# Patient Record
Sex: Female | Born: 1950 | Race: White | Hispanic: No | Marital: Married | Smoking: Never smoker
Health system: Southern US, Community
[De-identification: ages and names within clinical notes are randomized; demographics above are authoritative.]

---

## 1997-08-14 ENCOUNTER — Ambulatory Visit (HOSPITAL_COMMUNITY): Admission: RE | Admit: 1997-08-14 | Discharge: 1997-08-14 | Payer: Self-pay | Admitting: Obstetrics & Gynecology

## 1998-01-14 ENCOUNTER — Other Ambulatory Visit: Admission: RE | Admit: 1998-01-14 | Discharge: 1998-01-14 | Payer: Self-pay | Admitting: *Deleted

## 1998-04-12 ENCOUNTER — Ambulatory Visit (HOSPITAL_COMMUNITY): Admission: RE | Admit: 1998-04-12 | Discharge: 1998-04-12 | Payer: Self-pay | Admitting: *Deleted

## 1998-04-12 ENCOUNTER — Encounter: Payer: Self-pay | Admitting: *Deleted

## 1998-08-30 ENCOUNTER — Encounter: Payer: Self-pay | Admitting: *Deleted

## 1998-08-30 ENCOUNTER — Ambulatory Visit (HOSPITAL_COMMUNITY): Admission: RE | Admit: 1998-08-30 | Discharge: 1998-08-30 | Payer: Self-pay | Admitting: *Deleted

## 1999-01-20 ENCOUNTER — Other Ambulatory Visit: Admission: RE | Admit: 1999-01-20 | Discharge: 1999-01-20 | Payer: Self-pay | Admitting: *Deleted

## 1999-02-10 ENCOUNTER — Ambulatory Visit (HOSPITAL_COMMUNITY): Admission: RE | Admit: 1999-02-10 | Discharge: 1999-02-10 | Payer: Self-pay | Admitting: *Deleted

## 1999-02-10 ENCOUNTER — Encounter: Payer: Self-pay | Admitting: *Deleted

## 1999-09-30 ENCOUNTER — Encounter: Admission: RE | Admit: 1999-09-30 | Discharge: 1999-09-30 | Payer: Self-pay | Admitting: *Deleted

## 1999-09-30 ENCOUNTER — Encounter: Payer: Self-pay | Admitting: *Deleted

## 2000-05-12 ENCOUNTER — Other Ambulatory Visit: Admission: RE | Admit: 2000-05-12 | Discharge: 2000-05-12 | Payer: Self-pay | Admitting: *Deleted

## 2000-09-15 ENCOUNTER — Ambulatory Visit (HOSPITAL_COMMUNITY): Admission: RE | Admit: 2000-09-15 | Discharge: 2000-09-15 | Payer: Self-pay | Admitting: Orthopaedic Surgery

## 2000-09-15 ENCOUNTER — Encounter: Payer: Self-pay | Admitting: Orthopaedic Surgery

## 2000-09-21 ENCOUNTER — Ambulatory Visit (HOSPITAL_BASED_OUTPATIENT_CLINIC_OR_DEPARTMENT_OTHER): Admission: RE | Admit: 2000-09-21 | Discharge: 2000-09-21 | Payer: Self-pay | Admitting: Orthopaedic Surgery

## 2000-10-06 ENCOUNTER — Encounter: Admission: RE | Admit: 2000-10-06 | Discharge: 2000-10-06 | Payer: Self-pay | Admitting: *Deleted

## 2000-10-06 ENCOUNTER — Encounter: Payer: Self-pay | Admitting: *Deleted

## 2000-10-22 ENCOUNTER — Emergency Department (HOSPITAL_COMMUNITY): Admission: EM | Admit: 2000-10-22 | Discharge: 2000-10-22 | Payer: Self-pay | Admitting: Emergency Medicine

## 2000-11-09 ENCOUNTER — Encounter: Payer: Self-pay | Admitting: Orthopaedic Surgery

## 2000-11-09 ENCOUNTER — Encounter: Admission: RE | Admit: 2000-11-09 | Discharge: 2000-11-09 | Payer: Self-pay | Admitting: Orthopaedic Surgery

## 2000-11-09 ENCOUNTER — Ambulatory Visit (HOSPITAL_COMMUNITY): Admission: RE | Admit: 2000-11-09 | Discharge: 2000-11-09 | Payer: Self-pay | Admitting: Urology

## 2000-11-13 ENCOUNTER — Encounter: Payer: Self-pay | Admitting: Urology

## 2000-11-13 ENCOUNTER — Ambulatory Visit (HOSPITAL_COMMUNITY): Admission: RE | Admit: 2000-11-13 | Discharge: 2000-11-13 | Payer: Self-pay | Admitting: Urology

## 2001-05-24 ENCOUNTER — Other Ambulatory Visit: Admission: RE | Admit: 2001-05-24 | Discharge: 2001-05-24 | Payer: Self-pay | Admitting: *Deleted

## 2001-06-10 ENCOUNTER — Encounter: Admission: RE | Admit: 2001-06-10 | Discharge: 2001-06-10 | Payer: Self-pay | Admitting: Orthopaedic Surgery

## 2001-06-10 ENCOUNTER — Encounter: Payer: Self-pay | Admitting: Orthopaedic Surgery

## 2001-06-15 ENCOUNTER — Encounter: Admission: RE | Admit: 2001-06-15 | Discharge: 2001-06-15 | Payer: Self-pay | Admitting: *Deleted

## 2001-06-15 ENCOUNTER — Encounter: Payer: Self-pay | Admitting: *Deleted

## 2001-10-13 ENCOUNTER — Encounter: Payer: Self-pay | Admitting: *Deleted

## 2001-10-13 ENCOUNTER — Encounter: Admission: RE | Admit: 2001-10-13 | Discharge: 2001-10-13 | Payer: Self-pay | Admitting: *Deleted

## 2002-06-14 ENCOUNTER — Other Ambulatory Visit: Admission: RE | Admit: 2002-06-14 | Discharge: 2002-06-14 | Payer: Self-pay | Admitting: *Deleted

## 2002-10-02 ENCOUNTER — Encounter: Payer: Self-pay | Admitting: *Deleted

## 2002-10-02 ENCOUNTER — Ambulatory Visit (HOSPITAL_COMMUNITY): Admission: RE | Admit: 2002-10-02 | Discharge: 2002-10-02 | Payer: Self-pay | Admitting: *Deleted

## 2002-10-18 ENCOUNTER — Encounter: Admission: RE | Admit: 2002-10-18 | Discharge: 2002-10-18 | Payer: Self-pay | Admitting: *Deleted

## 2002-10-18 ENCOUNTER — Encounter: Payer: Self-pay | Admitting: *Deleted

## 2003-05-02 ENCOUNTER — Other Ambulatory Visit: Admission: RE | Admit: 2003-05-02 | Discharge: 2003-05-02 | Payer: Self-pay | Admitting: *Deleted

## 2003-06-27 ENCOUNTER — Encounter: Admission: RE | Admit: 2003-06-27 | Discharge: 2003-06-27 | Payer: Self-pay | Admitting: Internal Medicine

## 2003-10-22 ENCOUNTER — Encounter: Admission: RE | Admit: 2003-10-22 | Discharge: 2003-10-22 | Payer: Self-pay | Admitting: *Deleted

## 2003-11-08 ENCOUNTER — Encounter: Admission: RE | Admit: 2003-11-08 | Discharge: 2003-11-08 | Payer: Self-pay | Admitting: Diagnostic Radiology

## 2003-11-08 ENCOUNTER — Ambulatory Visit (HOSPITAL_COMMUNITY): Admission: RE | Admit: 2003-11-08 | Discharge: 2003-11-08 | Payer: Self-pay | Admitting: Neurosurgery

## 2004-05-22 ENCOUNTER — Other Ambulatory Visit: Admission: RE | Admit: 2004-05-22 | Discharge: 2004-05-22 | Payer: Self-pay | Admitting: *Deleted

## 2004-06-16 ENCOUNTER — Ambulatory Visit (HOSPITAL_COMMUNITY): Admission: RE | Admit: 2004-06-16 | Discharge: 2004-06-16 | Payer: Self-pay | Admitting: *Deleted

## 2004-08-08 ENCOUNTER — Encounter: Admission: RE | Admit: 2004-08-08 | Discharge: 2004-08-08 | Payer: Self-pay | Admitting: Diagnostic Radiology

## 2004-10-22 ENCOUNTER — Encounter: Admission: RE | Admit: 2004-10-22 | Discharge: 2004-10-22 | Payer: Self-pay | Admitting: *Deleted

## 2005-05-05 ENCOUNTER — Encounter: Admission: RE | Admit: 2005-05-05 | Discharge: 2005-05-05 | Payer: Self-pay | Admitting: Specialist

## 2005-07-29 ENCOUNTER — Other Ambulatory Visit: Admission: RE | Admit: 2005-07-29 | Discharge: 2005-07-29 | Payer: Self-pay | Admitting: Obstetrics & Gynecology

## 2005-10-27 ENCOUNTER — Encounter: Admission: RE | Admit: 2005-10-27 | Discharge: 2005-10-27 | Payer: Self-pay | Admitting: Internal Medicine

## 2006-06-25 ENCOUNTER — Encounter: Admission: RE | Admit: 2006-06-25 | Discharge: 2006-06-25 | Payer: Self-pay | Admitting: Orthopaedic Surgery

## 2006-08-09 ENCOUNTER — Ambulatory Visit (HOSPITAL_BASED_OUTPATIENT_CLINIC_OR_DEPARTMENT_OTHER): Admission: RE | Admit: 2006-08-09 | Discharge: 2006-08-09 | Payer: Self-pay | Admitting: Orthopaedic Surgery

## 2006-11-01 ENCOUNTER — Encounter: Admission: RE | Admit: 2006-11-01 | Discharge: 2006-11-01 | Payer: Self-pay | Admitting: Internal Medicine

## 2007-11-03 ENCOUNTER — Ambulatory Visit (HOSPITAL_COMMUNITY): Admission: RE | Admit: 2007-11-03 | Discharge: 2007-11-03 | Payer: Self-pay | Admitting: Obstetrics & Gynecology

## 2007-11-07 ENCOUNTER — Encounter: Admission: RE | Admit: 2007-11-07 | Discharge: 2007-11-07 | Payer: Self-pay | Admitting: Obstetrics & Gynecology

## 2007-11-10 ENCOUNTER — Encounter: Admission: RE | Admit: 2007-11-10 | Discharge: 2007-11-10 | Payer: Self-pay | Admitting: Obstetrics & Gynecology

## 2008-11-17 ENCOUNTER — Emergency Department (HOSPITAL_COMMUNITY): Admission: EM | Admit: 2008-11-17 | Discharge: 2008-11-18 | Payer: Self-pay | Admitting: Emergency Medicine

## 2008-11-19 ENCOUNTER — Encounter: Admission: RE | Admit: 2008-11-19 | Discharge: 2008-11-19 | Payer: Self-pay | Admitting: Internal Medicine

## 2008-11-21 ENCOUNTER — Encounter: Admission: RE | Admit: 2008-11-21 | Discharge: 2008-11-21 | Payer: Self-pay | Admitting: Obstetrics & Gynecology

## 2009-10-02 ENCOUNTER — Encounter: Admission: RE | Admit: 2009-10-02 | Discharge: 2009-10-02 | Payer: Self-pay | Admitting: Internal Medicine

## 2009-11-22 ENCOUNTER — Encounter: Admission: RE | Admit: 2009-11-22 | Discharge: 2009-11-22 | Payer: Self-pay | Admitting: Internal Medicine

## 2010-06-22 ENCOUNTER — Encounter: Payer: Self-pay | Admitting: Interventional Radiology

## 2010-06-22 ENCOUNTER — Encounter: Payer: Self-pay | Admitting: Neurology

## 2010-09-08 LAB — CBC
HCT: 38 % (ref 36.0–46.0)
Hemoglobin: 13.1 g/dL (ref 12.0–15.0)
MCHC: 34.5 g/dL (ref 30.0–36.0)
Platelets: 188 10*3/uL (ref 150–400)
RDW: 12.6 % (ref 11.5–15.5)

## 2010-09-08 LAB — BASIC METABOLIC PANEL
BUN: 22 mg/dL (ref 6–23)
Chloride: 104 mEq/L (ref 96–112)
Creatinine, Ser: 1.08 mg/dL (ref 0.4–1.2)
GFR calc Af Amer: >60 mL/min (ref >60)
Potassium: 4 mEq/L (ref 3.5–5.1)

## 2010-09-08 LAB — URINALYSIS, ROUTINE W REFLEX MICROSCOPIC
Bilirubin Urine: NEGATIVE
Glucose, UA: NEGATIVE mg/dL
Ketones, ur: NEGATIVE mg/dL
Protein, ur: NEGATIVE mg/dL
Specific Gravity, Urine: 1.017 (ref 1.005–1.030)
Urobilinogen, UA: 0.2 mg/dL (ref 0.0–1.0)
pH: 6 (ref 5.0–8.0)

## 2010-09-08 LAB — DIFFERENTIAL
Basophils Relative: 1 % (ref 0–1)
Neutro Abs: 12.8 10*3/uL — ABNORMAL HIGH (ref 1.7–7.7)

## 2010-10-17 NOTE — Op Note (Signed)
[facility]  Patient:    Mary Bradley, Mary Bradley                       MRN: [id number] Proc. Date: 11/09/00 Adm. Date:  [id number] Disc. Date: [id number] Attending:  Shelba Flake                           Operative Report  PREOPERATIVE DIAGNOSIS:  Right distal ureteral lithiasis.  POSTOPERATIVE DIAGNOSIS:  Right distal ureteral lithiasis.  OPERATION PERFORMED:  Cystourethroscopy, right retrograde ureteral pyelogram, right ureteroscopy with stone removal and placement of right double J stent.  SURGEON:  Gaynelle Arabian, M.D.  ANESTHESIA:  General endotracheal.  ESTIMATED BLOOD LOSS:  Negligible.  TUBES:  24 cm 6 [place] Surgitek double pigtail stent.  COMPLICATIONS:  None.  INDICATIONS FOR PROCEDURE:  Mary Bradley is a lovely 60 year old white female who presented with an 18 hour history of right buttocks pain, nausea, vomiting and lethargy. She subsequently underwent CT scan urogram which revealed a distal calculus with significant hydroureteronephrosis. She also had two punctate stones within the left kidney. With the 18 hours or persistent pain, nausea and vomiting, it was elected to proceed with the above procedure.  DESCRIPTION OF PROCEDURE:  The patient was placed in the supine position after proper general endotracheal anesthesia was placed in the dorsal lithotomy position, prepped and draped with Betadine in a sterile fashion. Cystourethroscopy was performed with a 22.5 French Olympus panendoscope utilizing the 12 and 70 degree lenses. The bladder was carefully inspected and efflux of clear urine was noted from the left ureteral orifice. The bladder was smooth wall and the urethra was normal. A 0.38 French Teflon coated guidewire was placed into the right renal pelvis and a flush of hydronephrotic flush was noted and attempted ureteroscopy with the short thin ACMI ureteroscope revealed what was felt to be a ______  distal ureteral  stricture approximately 2-3 mm proximal to the ureteral orifice. It was felt that balloon dilatation was indicated and under fluoroscopic guidance, the right distal ureter was balloon dilated for three minutes at 10 atmospheres of pressure with a 5 cm 4 mm microvasive balloon dilator and this was removed and the wire was maintained in place. Ureteroscopy was then performed with a short thin ACMI ureteroscope. The lower two-thirds ureter was examined and the stricture was widely patent and the stone was easily flushed from the ureter into the bladder. No other lesions were noted to be present and no perforations were noted. The stone was extracted, the ureteroscope was removed. Utilizing a 6 [place] open ended catheter over the wire, a retrograde ureteral pyelogram was performed and there was no extravasation noted. The wire was replaced and under fluoroscopic guidance, a 24 cm 6 [place] Surgitek double pigtail stent was placed with a pullout string and noted to be in good position fluoroscopically. The bladder was drained, the panendoscope was removed and the patient was taken to the recovery room stable and the stone will be submitted for stone analysis. DD:  11/09/00 TD:  11/09/00 Job: [id number]

## 2010-10-17 NOTE — Op Note (Signed)
Mary Bradley, PEACHEY               ACCOUNT NO.:  [id number]   MEDICAL RECORD NO.:  [id number]          PATIENT TYPE:  AMB   LOCATION:  DSC                          FACILITY:  [facility]   PHYSICIAN:  Claude Manges. Whitfield, M.D.DATE OF BIRTH:  1951/01/30   DATE OF PROCEDURE:  08/09/2006  DATE OF DISCHARGE:                               OPERATIVE REPORT   PREOPERATIVE DIAGNOSIS:  Complex tear posterior horn medial meniscus.   POSTOPERATIVE DIAGNOSIS:  Complex tear posterior horn medial meniscus.   PROCEDURES:  1. Diagnostic arthroscopy right knee.  2. Partial medial meniscectomy.   SURGEON:  Claude Manges. Cleophas Dunker, M.D.   ASSISTANT:  None.   ANESTHESIA:  Local with MAC.   COMPLICATIONS:  None.   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  Pain initiated when  she was walking the dogs in the backyard with a twisting mechanism of  her knee, experienced onset of pain.  She has continued to have pain to  the point of compromise.  She has had a recent MRI scan that revealed a  tear of the posterior horn of the medial meniscus reaching the meniscus  undersurface with a small radial component consistent with a complex  tear.  There was some focal fissuring of the cartilage of the medial  patella facet.  She is now to have an arthroscopic evaluation.   PROCEDURE:  The patient comfortable on the operating table and under IV  sedation, the right lower extremity was placed in a thigh holder.  The  leg was then prepped with DuraPrep from the thigh holder to the ankle.  Sterile draping was performed.   Diagnostic arthroscopy was performed using a medial and lateral  parapatellar tendon puncture sites.  There was a clear yellow joint  effusion.   Diagnostic arthroscopy revealed no evidence of synovitis in the superior  pouch.  There were no loose bodies.  There was minimal chondromalacia of  the patella as outlined by the  MRI scan.  The gutters were clear.   The lateral meniscus was clear of pathology.  There was no evidence of  chondromalacia.  The lateral compartment ACL was intact.   The medial compartment confirmed the presence of a complex tear of the  posterior third of the medial meniscus.  There were a combination of  flap and horizontal cleavage tears.  Using the basket forceps, the Cuda  shaver and the ArthroCare wand the torn portions were removed and the  remaining meniscal rim was sealed with the articular wand.  I carefully  probed the remaining meniscus without evidence of any loose material or  unstable meniscal segments the joint was then explored without evidence  of loose material.  Two puncture sites left open and infiltrated 0.25%  Marcaine with epinephrine.  A sterile bulky dressing was applied  followed by an Ace bandage.   PLAN:  Vicodin for pain.  Office 1 week.      Claude Manges. Cleophas Dunker, M.D.  Electronically Signed     PWW/MEDQ  D:  08/09/2006  T:  08/09/2006  Job:  [id number]

## 2010-10-17 NOTE — Op Note (Signed)
[place]  Patient:    Mary Bradley, Mary Bradley                       MRN: [id number] Proc. Date: 09/21/00 Adm. Date:  [id number] Attending:  Randolm Idol                           Operative Report  PREOPERATIVE DIAGNOSIS:  Tear of medial meniscus, left knee.  POSTOPERATIVE DIAGNOSIS:  Tear of medial meniscus, left knee.  OPERATION PERFORMED: 1. Diagnostic arthroscopy, left knee. 2. Partial medial meniscectomy.  SURGEON:  Claude Manges. Cleophas Dunker, M.D.  ANESTHESIA:  IV sedation and local Marcaine with epinephrine.  COMPLICATIONS:  None.  INDICATIONS FOR PROCEDURE:  The patient is a 60 year old female with about a three-week history of injury to her left knee while playing tennis.  She had a positive effusion associated with pain, causing limp and limitation of activities.  MRI scan was performed one week ago because of persistent symptoms revealing a complex tear of the posterior horn of the medial meniscus.  She is now to have arthroscopic evaluation.  DESCRIPTION OF PROCEDURE:  With the patient comfortable on the operating table and under minimal IV sedation, the left lower extremity was placed in the thigh tourniquet and then a thigh holder.  The leg was then prepped with DuraPrep from the thigh holder to the ankle.  Sterile draping was performed.  Diagnostic arthroscopy was performed using a medial and lateral parapatellar tendon stab wound.  A third portal was not necessary for irrigation as I could adequately irrigate through the two established portals.  The arthroscope was placed in the lateral joint.  A diagnostic arthroscopy was then performed.  There was no evidence of any loose material in the superior pouch.  The patellofemoral joint was clear.  The gutters were clear.  I did not see evidence of a significant plica formation.  The ACL was perfectly intact.  The lateral compartment was intact without evidence of meniscal  pathology or chondromalacia.  The medial compartment was then evaluated.  The anterior half of the medial meniscus appeared to be perfectly intact.  There was an obvious complex tear beginning at about the midportion of the meniscus extending posteriorly. There was a combination of flap tears and cleavage tears and significant maceration of much of the posterior third of the meniscus.  Using a series of basket forceps and the 3.5 mm cuda shaver, the meniscus was removed to stable rim.  I carefully probed and felt that I had no further unstable segments. There was no evidence of any significant bleeding.  Again I carefully probed the meniscus. I  did not see any loose fragments.  The joint was again inspected without evidence of loose material.  The two stab wounds were left open and infiltrated with 0.25% Marcaine with epinephrine.  A sterile bulky dressing was applied followed by an Ace bandage. The patient tolerated the procedure well without complications.  PLAN:  Percocet for pain.  Phenergan as an antiemetic.  Office one week. Instructions given regarding potential problems with infection or phlebitis. DD:  09/21/00 TD:  09/22/00 Job: [id number]

## 2010-10-17 NOTE — Op Note (Signed)
[facility]  Patient:    Mary Bradley, Mary Bradley                       MRN: [id number] Proc. Date: 10/22/00 Adm. Date:  [id number] Disc. Date: [id number] Attending:  Shelba Flake CC:         [name]. Effie Shy, M.D.  Colon Flattery. Gery Pray, M.D.   Operative Report  DATE OF BIRTH:  1951/02/04  I had the pleasure to see Mary Bradley in the emergency room, Oct 22, 2000. Mary Bradley is a pleasant 60 year old white female, who sustained an injury to her right thumb when she was trying to break up a scuffle between her dogs. During the scuffle, her Mary Bradley bit her thumb.  This is her right hand dominant right thumb.  She has dorsal and volar lacerations.  Dorsal lacerations are x 2 in the region of the dorsoradial MCP region.  Volar laceration is volar ulnar at the volar MCP region.  The patient states that she has fairly significant pain but was not particularly concerned until the bleeding would not stop.  She subsequently was seen in the emergency room.  Dr. Mechele Collin L. Effie Shy evaluated her and asked me to see her in regards to her upper extremity predicament.  Tetanus shot has been given.  The patient denies any specific numbness and tingling in the radial or ulnar distal nerve distribution subjectively.  I have discussed with her all issues.  She denies any other injury secondary to this recent occurrence.  PAST MEDICAL HISTORY:  Migraine headaches.  PAST SURGICAL HISTORY:  Knee arthroscopy recently by Dr. Cleophas Dunker.  MEDICINES:  The patient takes p.r.n. medicines only.  DRUG ALLERGIES:  PENICILLIN including AMOXICILLIN.  SOCIAL HISTORY:  She is a nonsmoker.  She is a Teacher, early years/pre.  PHYSICAL EXAMINATION:  GENERAL:  A very pleasant white female alert and oriented and in no acute distress.  VITAL SIGNS:  Afebrile.  Vital signs are stable.  RIGHT THUMB:  The patients right thumb has positive refill at the tip.  She has two dorsoradial lacerations secondary to  dog bite.  One is over the MCP region.  She has a volar ulnar laceration as well.  FPL and EPL are intact. There is no ulnar, radial, or collateral ligament insufficiency about the MCP joint.  IP joint, and CMC joint are stable.  The remaining fingers, hand, forearm, and shoulder including the elbow are nontender.  There are no signs of dystrophic reaction or acute neurovascular compromise.  No signs of compartment syndrome.  The patient had two of three bite wounds which are fairly deep, one dorsal, one volar.  The patient also has a bit of a contusion to her lower extremity; however, there are no open lacerations in this region.  I have reviewed this.  There is ecchymosis only in this region.  IMPRESSION:  Status post dog bit to the right thumb.  PLAN:  I verbally consented her for I&D and repair of structure as necessary.  DESCRIPTION OF PROCEDURE:  Mary Bradley was given a lidocaine digital block about the thumb.  She tolerated this well without difficulty.  Following this, she underwent a Betadine scrub followed by Betadine painting and draping.  Once this was done, she underwent an I&D of the dorsal wounds.  One dorsal wound was superficial in nature only and required only a light I&D.  The second wound was quite deep and tunneled underneath the skin.  This was opened.  The prenecrotic edges were debrided with #15 blade scalpel, and the patient then had copious irrigation of the dead space.  The dead space was then packed with Iodoform gauze after irrigation.  I should note that there was a fairly dead space.  I did not see any obvious joint encroachment.  The volar ulnar wound similarly had some tunneling; however, it was not as impressive as the dorsal wound.  This was treated with I&D, including debridement of the prenecrotic skin edge and I&D of the skin and subcutaneous tissue.  In all, the patient underwent I&D of the skin and subcutaneous tissue in the deep recesses where the  bite wounds tracked.  The peritendonous regions were also debrided.  She tolerated this without difficulty, and the Iodoform was placed to my satisfaction.  She was neurovascularly intact after the procedure in terms of her vascular exam; however, she certainly had numbness in her thumb secondary to the previously placed block.  She was sterilely dressed and instructed on wound care and follow-up.  The patient will follow up in 4-5 days to ensure that she is coming along nicely.  She is going to pull her Iodoform tomorrow and begin b.i.d. peroxide treatments and continue dressing changes.  I have asked her to refrain from stressful sport activity about the thumb until she is seen in follow-up.  We have placed her on clindamycin 300 mg t.i.d. x 7 days and Cipro 500 mg 1 p.o. b.i.d. x 7 days.  I have asked her to notify me should any abrupt changes occur under exam or problems develop.  All questions have been encouraged and answered.  It was certainly a pleasure to see her today, and I appreciate the kind referral from Dr. Effie Shy. DD:  10/22/00 TD:  10/24/00 Job: [id number]

## 2010-10-24 ENCOUNTER — Other Ambulatory Visit: Payer: Self-pay | Admitting: Internal Medicine

## 2010-10-24 DIAGNOSIS — Z1231 Encounter for screening mammogram for malignant neoplasm of breast: Secondary | ICD-10-CM

## 2010-12-08 ENCOUNTER — Ambulatory Visit
Admission: RE | Admit: 2010-12-08 | Discharge: 2010-12-08 | Disposition: A | Discharge status: Home / Self Care | Payer: PRIVATE HEALTH INSURANCE | Source: Ambulatory Visit | Attending: Internal Medicine | Admitting: Internal Medicine

## 2010-12-08 DIAGNOSIS — Z1231 Encounter for screening mammogram for malignant neoplasm of breast: Secondary | ICD-10-CM

## 2011-07-06 ENCOUNTER — Ambulatory Visit
Admission: RE | Admit: 2011-07-06 | Discharge: 2011-07-06 | Disposition: A | Discharge status: Home / Self Care | Payer: PRIVATE HEALTH INSURANCE | Source: Ambulatory Visit | Attending: Obstetrics & Gynecology | Admitting: Obstetrics & Gynecology

## 2011-07-06 ENCOUNTER — Other Ambulatory Visit: Payer: Self-pay | Admitting: Obstetrics & Gynecology

## 2011-07-06 DIAGNOSIS — R1900 Intra-abdominal and pelvic swelling, mass and lump, unspecified site: Secondary | ICD-10-CM

## 2011-11-11 ENCOUNTER — Other Ambulatory Visit: Payer: Self-pay | Admitting: Obstetrics & Gynecology

## 2011-11-11 DIAGNOSIS — Z1231 Encounter for screening mammogram for malignant neoplasm of breast: Secondary | ICD-10-CM

## 2011-11-16 ENCOUNTER — Other Ambulatory Visit: Payer: Self-pay | Admitting: Internal Medicine

## 2011-11-16 DIAGNOSIS — M8580 Other specified disorders of bone density and structure, unspecified site: Secondary | ICD-10-CM

## 2011-12-09 ENCOUNTER — Ambulatory Visit
Admission: RE | Admit: 2011-12-09 | Discharge: 2011-12-09 | Disposition: A | Discharge status: Home / Self Care | Payer: PRIVATE HEALTH INSURANCE | Source: Ambulatory Visit | Attending: Obstetrics & Gynecology | Admitting: Obstetrics & Gynecology

## 2011-12-09 DIAGNOSIS — Z1231 Encounter for screening mammogram for malignant neoplasm of breast: Secondary | ICD-10-CM

## 2011-12-29 ENCOUNTER — Ambulatory Visit
Admission: RE | Admit: 2011-12-29 | Discharge: 2011-12-29 | Disposition: A | Discharge status: Home / Self Care | Payer: PRIVATE HEALTH INSURANCE | Source: Ambulatory Visit | Attending: Internal Medicine | Admitting: Internal Medicine

## 2011-12-29 DIAGNOSIS — M8580 Other specified disorders of bone density and structure, unspecified site: Secondary | ICD-10-CM

## 2012-10-11 ENCOUNTER — Ambulatory Visit
Admission: RE | Admit: 2012-10-11 | Discharge: 2012-10-11 | Disposition: A | Discharge status: Home / Self Care | Payer: PRIVATE HEALTH INSURANCE | Source: Ambulatory Visit | Attending: Family Medicine | Admitting: Family Medicine

## 2012-10-11 ENCOUNTER — Other Ambulatory Visit: Payer: Self-pay | Admitting: Family Medicine

## 2012-10-11 DIAGNOSIS — R319 Hematuria, unspecified: Secondary | ICD-10-CM

## 2012-10-11 DIAGNOSIS — R109 Unspecified abdominal pain: Secondary | ICD-10-CM

## 2012-10-11 DIAGNOSIS — Z87442 Personal history of urinary calculi: Secondary | ICD-10-CM

## 2012-10-11 DIAGNOSIS — R10A2 Flank pain, left side: Secondary | ICD-10-CM

## 2012-11-09 ENCOUNTER — Other Ambulatory Visit: Payer: Self-pay

## 2012-11-09 DIAGNOSIS — Z1231 Encounter for screening mammogram for malignant neoplasm of breast: Secondary | ICD-10-CM

## 2012-12-14 ENCOUNTER — Ambulatory Visit
Admission: RE | Admit: 2012-12-14 | Discharge: 2012-12-14 | Disposition: A | Discharge status: Home / Self Care | Payer: PRIVATE HEALTH INSURANCE | Source: Ambulatory Visit

## 2012-12-14 DIAGNOSIS — Z1231 Encounter for screening mammogram for malignant neoplasm of breast: Secondary | ICD-10-CM

## 2012-12-26 ENCOUNTER — Other Ambulatory Visit (HOSPITAL_COMMUNITY): Payer: Self-pay | Admitting: Obstetrics & Gynecology

## 2012-12-26 DIAGNOSIS — D219 Benign neoplasm of connective and other soft tissue, unspecified: Secondary | ICD-10-CM

## 2012-12-28 ENCOUNTER — Other Ambulatory Visit (HOSPITAL_COMMUNITY): Payer: Self-pay | Admitting: Obstetrics & Gynecology

## 2012-12-28 DIAGNOSIS — D219 Benign neoplasm of connective and other soft tissue, unspecified: Secondary | ICD-10-CM

## 2012-12-29 ENCOUNTER — Ambulatory Visit (HOSPITAL_COMMUNITY)
Admission: RE | Admit: 2012-12-29 | Discharge: 2012-12-29 | Disposition: A | Discharge status: Home / Self Care | Payer: PRIVATE HEALTH INSURANCE | Source: Ambulatory Visit | Attending: Obstetrics & Gynecology | Admitting: Obstetrics & Gynecology

## 2012-12-29 ENCOUNTER — Ambulatory Visit
Admission: RE | Admit: 2012-12-29 | Discharge: 2012-12-29 | Disposition: A | Discharge status: Home / Self Care | Payer: PRIVATE HEALTH INSURANCE | Source: Ambulatory Visit | Attending: Internal Medicine | Admitting: Internal Medicine

## 2012-12-29 ENCOUNTER — Other Ambulatory Visit: Payer: Self-pay | Admitting: Internal Medicine

## 2012-12-29 DIAGNOSIS — Z87442 Personal history of urinary calculi: Secondary | ICD-10-CM | POA: Insufficient documentation

## 2012-12-29 DIAGNOSIS — N854 Malposition of uterus: Secondary | ICD-10-CM | POA: Insufficient documentation

## 2012-12-29 DIAGNOSIS — Z0000 Encounter for general adult medical examination without abnormal findings: Secondary | ICD-10-CM

## 2012-12-29 DIAGNOSIS — D259 Leiomyoma of uterus, unspecified: Secondary | ICD-10-CM | POA: Insufficient documentation

## 2012-12-29 DIAGNOSIS — Z780 Asymptomatic menopausal state: Secondary | ICD-10-CM | POA: Insufficient documentation

## 2012-12-29 DIAGNOSIS — D219 Benign neoplasm of connective and other soft tissue, unspecified: Secondary | ICD-10-CM

## 2013-12-20 ENCOUNTER — Other Ambulatory Visit: Payer: Self-pay | Admitting: Internal Medicine

## 2013-12-20 DIAGNOSIS — M810 Age-related osteoporosis without current pathological fracture: Secondary | ICD-10-CM

## 2013-12-20 DIAGNOSIS — Z1231 Encounter for screening mammogram for malignant neoplasm of breast: Secondary | ICD-10-CM

## 2014-01-03 ENCOUNTER — Ambulatory Visit
Admission: RE | Admit: 2014-01-03 | Discharge: 2014-01-03 | Disposition: A | Discharge status: Home / Self Care | Payer: PRIVATE HEALTH INSURANCE | Source: Ambulatory Visit | Attending: Internal Medicine | Admitting: Internal Medicine

## 2014-01-03 ENCOUNTER — Encounter (INDEPENDENT_AMBULATORY_CARE_PROVIDER_SITE_OTHER): Payer: Self-pay

## 2014-01-03 DIAGNOSIS — M810 Age-related osteoporosis without current pathological fracture: Secondary | ICD-10-CM

## 2014-01-03 DIAGNOSIS — Z1231 Encounter for screening mammogram for malignant neoplasm of breast: Secondary | ICD-10-CM

## 2014-03-16 ENCOUNTER — Other Ambulatory Visit: Payer: Self-pay

## 2014-08-31 ENCOUNTER — Other Ambulatory Visit (HOSPITAL_COMMUNITY): Payer: Self-pay | Admitting: *Deleted

## 2014-09-03 ENCOUNTER — Encounter (HOSPITAL_COMMUNITY)
Admission: RE | Admit: 2014-09-03 | Discharge: 2014-09-03 | Disposition: A | Discharge status: Home / Self Care | Payer: PRIVATE HEALTH INSURANCE | Source: Ambulatory Visit | Attending: Internal Medicine | Admitting: Internal Medicine

## 2014-09-03 DIAGNOSIS — M810 Age-related osteoporosis without current pathological fracture: Secondary | ICD-10-CM | POA: Diagnosis present

## 2014-09-03 MED ORDER — DENOSUMAB 60 MG/ML ~~LOC~~ SOLN
60.0000 mg | Freq: Once | SUBCUTANEOUS | Status: AC
  Administered 2014-09-03: 60 mg via SUBCUTANEOUS
  Filled 2014-09-03: qty 1

## 2014-12-05 ENCOUNTER — Other Ambulatory Visit: Payer: Self-pay

## 2014-12-05 DIAGNOSIS — Z1231 Encounter for screening mammogram for malignant neoplasm of breast: Secondary | ICD-10-CM

## 2015-01-09 ENCOUNTER — Ambulatory Visit
Admission: RE | Admit: 2015-01-09 | Discharge: 2015-01-09 | Disposition: A | Discharge status: Home / Self Care | Payer: PRIVATE HEALTH INSURANCE | Source: Ambulatory Visit

## 2015-01-09 DIAGNOSIS — Z1231 Encounter for screening mammogram for malignant neoplasm of breast: Secondary | ICD-10-CM

## 2015-12-17 ENCOUNTER — Other Ambulatory Visit: Payer: Self-pay | Admitting: Internal Medicine

## 2015-12-17 DIAGNOSIS — Z1231 Encounter for screening mammogram for malignant neoplasm of breast: Secondary | ICD-10-CM

## 2016-01-13 ENCOUNTER — Other Ambulatory Visit: Payer: Self-pay | Admitting: Internal Medicine

## 2016-01-13 DIAGNOSIS — M810 Age-related osteoporosis without current pathological fracture: Secondary | ICD-10-CM

## 2016-01-21 ENCOUNTER — Ambulatory Visit
Admission: RE | Admit: 2016-01-21 | Discharge: 2016-01-21 | Disposition: A | Discharge status: Home / Self Care | Payer: PRIVATE HEALTH INSURANCE | Source: Ambulatory Visit | Attending: Internal Medicine | Admitting: Internal Medicine

## 2016-01-21 ENCOUNTER — Ambulatory Visit: Payer: PRIVATE HEALTH INSURANCE

## 2016-01-21 DIAGNOSIS — M810 Age-related osteoporosis without current pathological fracture: Secondary | ICD-10-CM

## 2016-01-21 DIAGNOSIS — Z1231 Encounter for screening mammogram for malignant neoplasm of breast: Secondary | ICD-10-CM

## 2016-02-18 ENCOUNTER — Ambulatory Visit
Admission: RE | Admit: 2016-02-18 | Discharge: 2016-02-18 | Disposition: A | Discharge status: Home / Self Care | Payer: PRIVATE HEALTH INSURANCE | Source: Ambulatory Visit | Attending: Internal Medicine | Admitting: Internal Medicine

## 2016-02-18 ENCOUNTER — Other Ambulatory Visit: Payer: Self-pay | Admitting: Internal Medicine

## 2016-02-18 DIAGNOSIS — R3129 Other microscopic hematuria: Secondary | ICD-10-CM

## 2016-02-18 MED ORDER — IOPAMIDOL (ISOVUE-300) INJECTION 61%
100.0000 mL | Freq: Once | INTRAVENOUS | Status: AC | PRN
  Administered 2016-02-18: 100 mL via INTRAVENOUS

## 2016-02-18 MED ORDER — IOPAMIDOL (ISOVUE-M 300) INJECTION 61%: 15.0000 mL | Freq: Once | INTRAMUSCULAR | Status: DC | PRN

## 2016-12-23 DIAGNOSIS — R350 Frequency of micturition: Secondary | ICD-10-CM | POA: Diagnosis not present

## 2016-12-24 ENCOUNTER — Other Ambulatory Visit: Payer: Self-pay | Admitting: Internal Medicine

## 2016-12-24 DIAGNOSIS — D259 Leiomyoma of uterus, unspecified: Secondary | ICD-10-CM

## 2016-12-24 DIAGNOSIS — R350 Frequency of micturition: Secondary | ICD-10-CM

## 2016-12-29 ENCOUNTER — Other Ambulatory Visit: Payer: PRIVATE HEALTH INSURANCE

## 2017-01-04 ENCOUNTER — Other Ambulatory Visit: Payer: Self-pay | Admitting: Internal Medicine

## 2017-01-04 DIAGNOSIS — Z1231 Encounter for screening mammogram for malignant neoplasm of breast: Secondary | ICD-10-CM

## 2017-01-05 ENCOUNTER — Ambulatory Visit
Admission: RE | Admit: 2017-01-05 | Discharge: 2017-01-05 | Disposition: A | Discharge status: Home / Self Care | Payer: Medicare Other | Source: Ambulatory Visit | Attending: Internal Medicine | Admitting: Internal Medicine

## 2017-01-05 DIAGNOSIS — R350 Frequency of micturition: Secondary | ICD-10-CM

## 2017-01-05 DIAGNOSIS — D259 Leiomyoma of uterus, unspecified: Secondary | ICD-10-CM | POA: Diagnosis not present

## 2017-01-12 ENCOUNTER — Other Ambulatory Visit: Payer: PRIVATE HEALTH INSURANCE

## 2017-01-18 DIAGNOSIS — Z23 Encounter for immunization: Secondary | ICD-10-CM | POA: Diagnosis not present

## 2017-01-25 DIAGNOSIS — Z681 Body mass index (BMI) 19 or less, adult: Secondary | ICD-10-CM | POA: Diagnosis not present

## 2017-01-25 DIAGNOSIS — Z124 Encounter for screening for malignant neoplasm of cervix: Secondary | ICD-10-CM | POA: Diagnosis not present

## 2017-01-26 ENCOUNTER — Ambulatory Visit
Admission: RE | Admit: 2017-01-26 | Discharge: 2017-01-26 | Disposition: A | Discharge status: Home / Self Care | Payer: Medicare Other | Source: Ambulatory Visit | Attending: Internal Medicine | Admitting: Internal Medicine

## 2017-01-26 DIAGNOSIS — Z1231 Encounter for screening mammogram for malignant neoplasm of breast: Secondary | ICD-10-CM | POA: Diagnosis not present

## 2017-02-08 DIAGNOSIS — R05 Cough: Secondary | ICD-10-CM | POA: Diagnosis not present

## 2017-02-08 DIAGNOSIS — Z681 Body mass index (BMI) 19 or less, adult: Secondary | ICD-10-CM | POA: Diagnosis not present

## 2017-02-08 DIAGNOSIS — J40 Bronchitis, not specified as acute or chronic: Secondary | ICD-10-CM | POA: Diagnosis not present

## 2017-02-08 DIAGNOSIS — H1032 Unspecified acute conjunctivitis, left eye: Secondary | ICD-10-CM | POA: Diagnosis not present

## 2017-02-13 DIAGNOSIS — Z23 Encounter for immunization: Secondary | ICD-10-CM | POA: Diagnosis not present

## 2017-02-22 DIAGNOSIS — H5213 Myopia, bilateral: Secondary | ICD-10-CM | POA: Diagnosis not present

## 2017-02-22 DIAGNOSIS — H04123 Dry eye syndrome of bilateral lacrimal glands: Secondary | ICD-10-CM | POA: Diagnosis not present

## 2017-02-22 DIAGNOSIS — H2513 Age-related nuclear cataract, bilateral: Secondary | ICD-10-CM | POA: Diagnosis not present

## 2017-02-22 DIAGNOSIS — H524 Presbyopia: Secondary | ICD-10-CM | POA: Diagnosis not present

## 2017-02-23 DIAGNOSIS — M810 Age-related osteoporosis without current pathological fracture: Secondary | ICD-10-CM | POA: Diagnosis not present

## 2017-03-15 DIAGNOSIS — Z0000 Encounter for general adult medical examination without abnormal findings: Secondary | ICD-10-CM | POA: Diagnosis not present

## 2017-03-15 DIAGNOSIS — M810 Age-related osteoporosis without current pathological fracture: Secondary | ICD-10-CM | POA: Diagnosis not present

## 2017-03-15 DIAGNOSIS — E7849 Other hyperlipidemia: Secondary | ICD-10-CM | POA: Diagnosis not present

## 2017-03-18 DIAGNOSIS — N200 Calculus of kidney: Secondary | ICD-10-CM | POA: Diagnosis not present

## 2017-03-18 DIAGNOSIS — J3089 Other allergic rhinitis: Secondary | ICD-10-CM | POA: Diagnosis not present

## 2017-03-18 DIAGNOSIS — Z0000 Encounter for general adult medical examination without abnormal findings: Secondary | ICD-10-CM | POA: Diagnosis not present

## 2017-03-18 DIAGNOSIS — G4709 Other insomnia: Secondary | ICD-10-CM | POA: Diagnosis not present

## 2017-03-18 DIAGNOSIS — G43909 Migraine, unspecified, not intractable, without status migrainosus: Secondary | ICD-10-CM | POA: Diagnosis not present

## 2017-03-18 DIAGNOSIS — Z1389 Encounter for screening for other disorder: Secondary | ICD-10-CM | POA: Diagnosis not present

## 2017-03-18 DIAGNOSIS — F418 Other specified anxiety disorders: Secondary | ICD-10-CM | POA: Diagnosis not present

## 2017-03-18 DIAGNOSIS — R946 Abnormal results of thyroid function studies: Secondary | ICD-10-CM | POA: Diagnosis not present

## 2017-03-18 DIAGNOSIS — Z23 Encounter for immunization: Secondary | ICD-10-CM | POA: Diagnosis not present

## 2017-03-18 DIAGNOSIS — E7849 Other hyperlipidemia: Secondary | ICD-10-CM | POA: Diagnosis not present

## 2017-03-18 DIAGNOSIS — M810 Age-related osteoporosis without current pathological fracture: Secondary | ICD-10-CM | POA: Diagnosis not present

## 2017-03-18 DIAGNOSIS — Z8249 Family history of ischemic heart disease and other diseases of the circulatory system: Secondary | ICD-10-CM | POA: Diagnosis not present

## 2017-05-04 DIAGNOSIS — I788 Other diseases of capillaries: Secondary | ICD-10-CM | POA: Diagnosis not present

## 2017-05-04 DIAGNOSIS — L821 Other seborrheic keratosis: Secondary | ICD-10-CM | POA: Diagnosis not present

## 2017-05-04 DIAGNOSIS — Z85828 Personal history of other malignant neoplasm of skin: Secondary | ICD-10-CM | POA: Diagnosis not present

## 2017-05-04 DIAGNOSIS — L720 Epidermal cyst: Secondary | ICD-10-CM | POA: Diagnosis not present

## 2017-06-23 DIAGNOSIS — Z85828 Personal history of other malignant neoplasm of skin: Secondary | ICD-10-CM | POA: Diagnosis not present

## 2017-06-23 DIAGNOSIS — L700 Acne vulgaris: Secondary | ICD-10-CM | POA: Diagnosis not present

## 2017-08-24 DIAGNOSIS — M810 Age-related osteoporosis without current pathological fracture: Secondary | ICD-10-CM | POA: Diagnosis not present

## 2017-12-13 DIAGNOSIS — L603 Nail dystrophy: Secondary | ICD-10-CM | POA: Diagnosis not present

## 2017-12-13 DIAGNOSIS — Z85828 Personal history of other malignant neoplasm of skin: Secondary | ICD-10-CM | POA: Diagnosis not present

## 2017-12-24 ENCOUNTER — Other Ambulatory Visit: Payer: Self-pay | Admitting: Internal Medicine

## 2017-12-24 DIAGNOSIS — Z1231 Encounter for screening mammogram for malignant neoplasm of breast: Secondary | ICD-10-CM

## 2018-01-21 IMAGING — US US TRANSVAGINAL NON-OB
1 series · 14 of 25 positions shown · non-contrast
Comparison: CT scan 02/18/2016

CLINICAL DATA: Urinary frequency.  History of fibroids.

EXAM:
TRANSABDOMINAL AND TRANSVAGINAL ULTRASOUND OF PELVIS
TECHNIQUE: Both transabdominal and transvaginal ultrasound examinations of the
pelvis were performed. Transabdominal technique was performed for
global imaging of the pelvis including uterus, ovaries, adnexal
regions, and pelvic cul-de-sac. It was necessary to proceed with
endovaginal exam following the transabdominal exam to visualize the
ovaries and endometrium.

[Series 1: us transvaginal non-ob · 0.16mm/px · 14 of 72 slices shown]
[im 1/72]
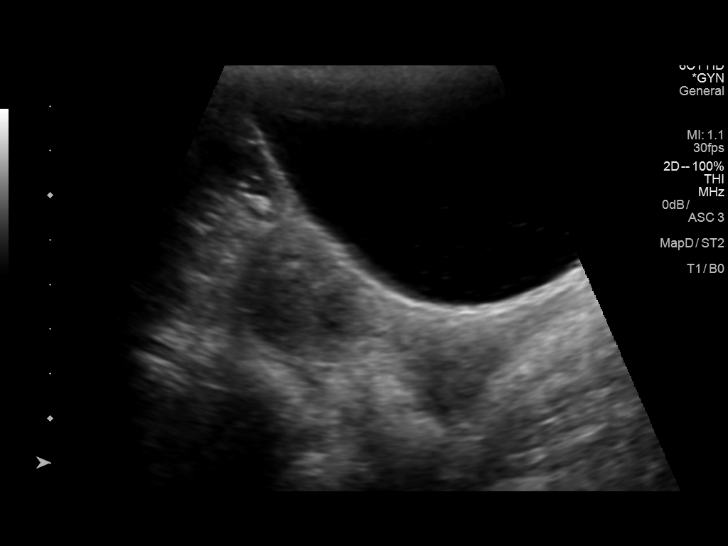
[im 6/72]
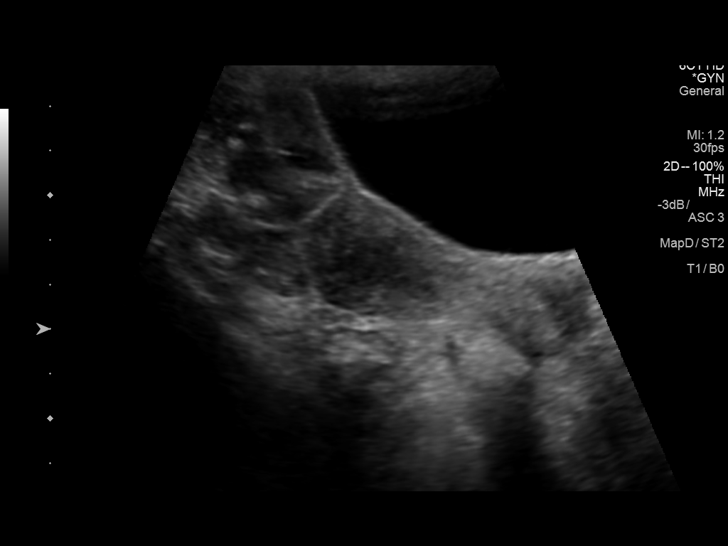
[im 12/72]
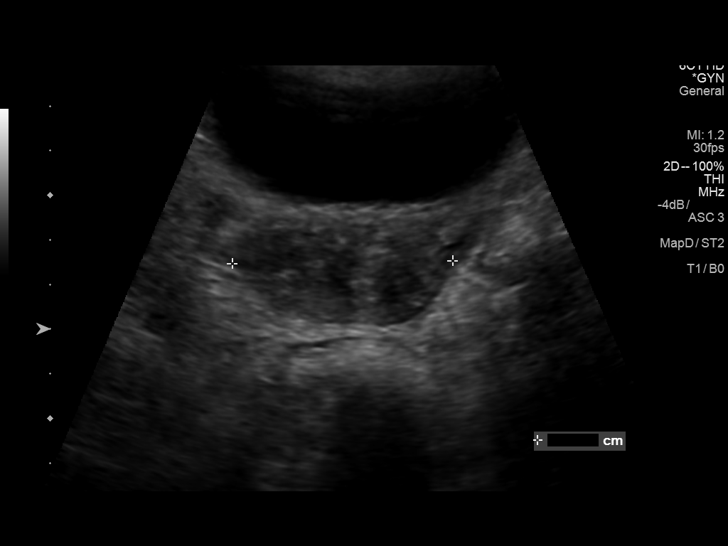
[im 18/72]
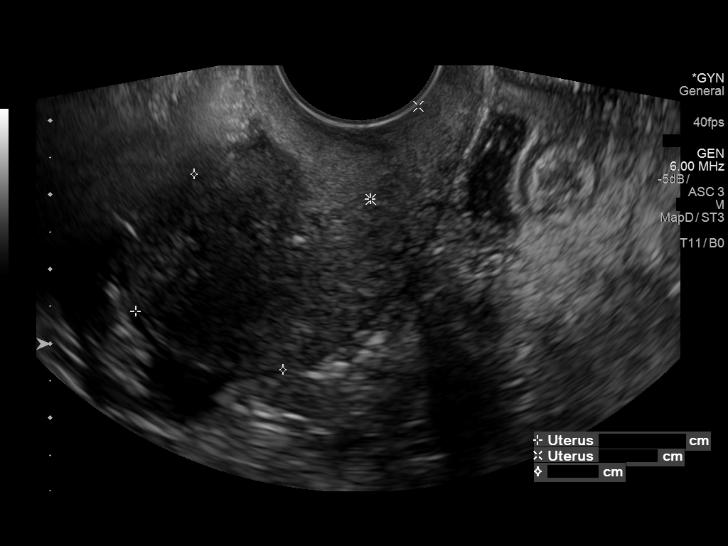
[im 24/72]
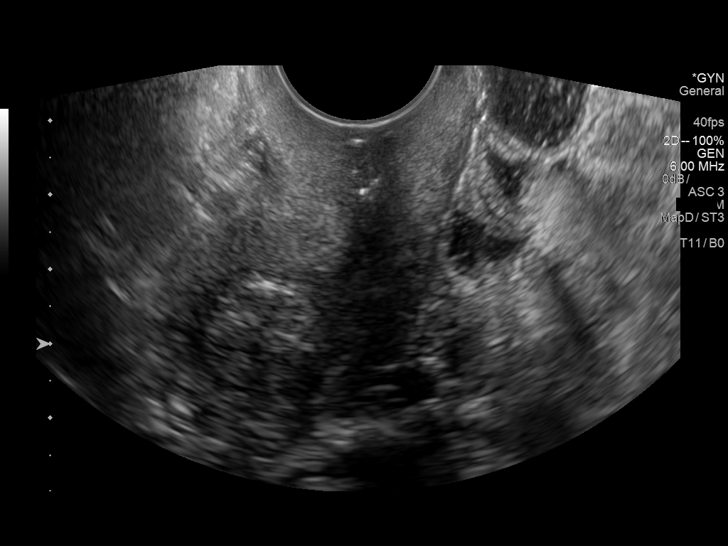
[im 27/72]
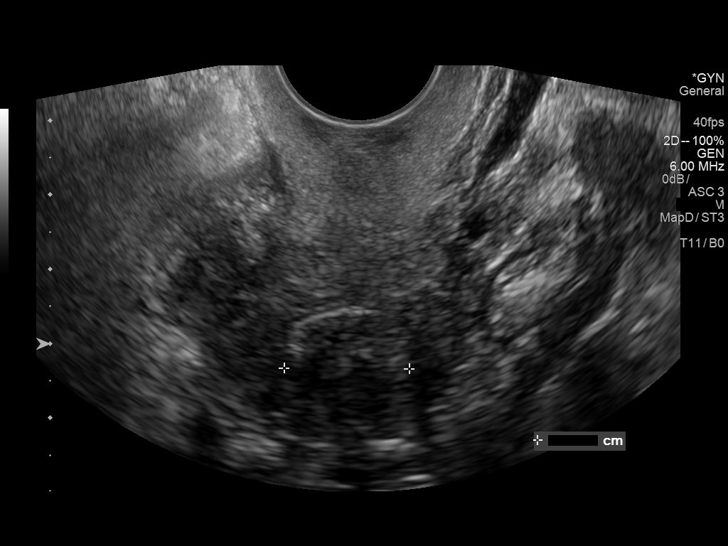
[im 33/72]
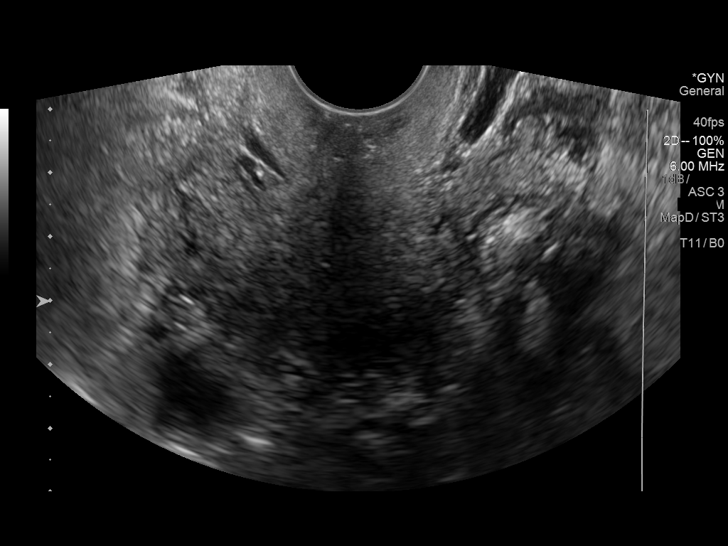
[im 39/72]
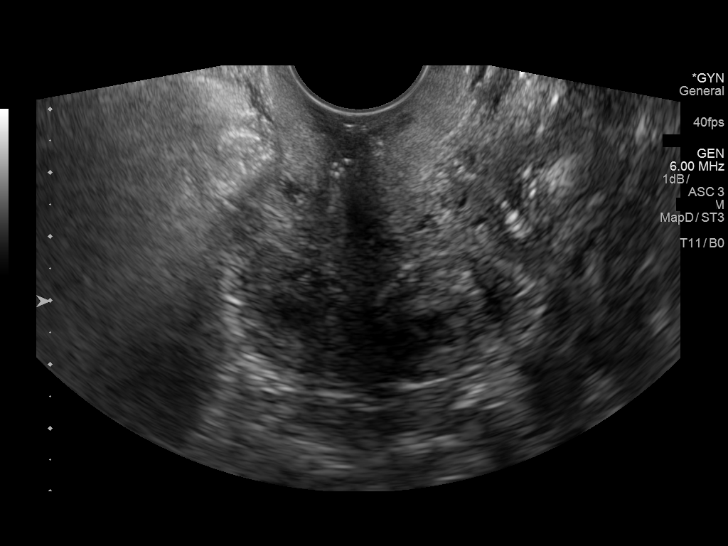
[im 45/72]
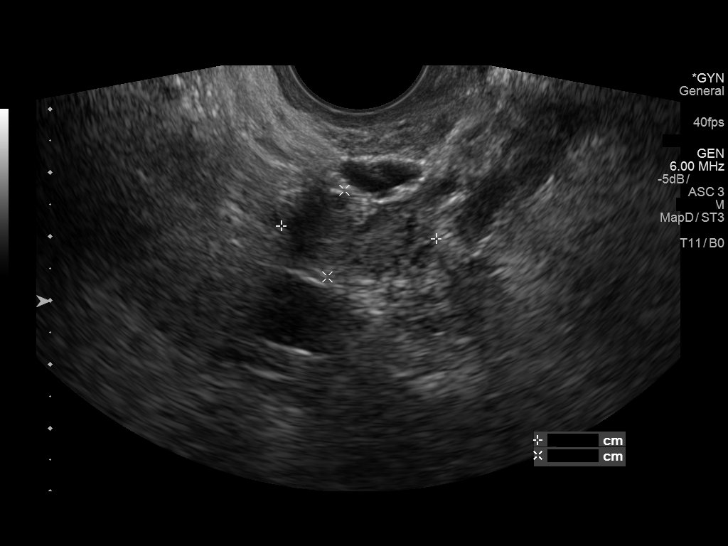
[im 48/72]
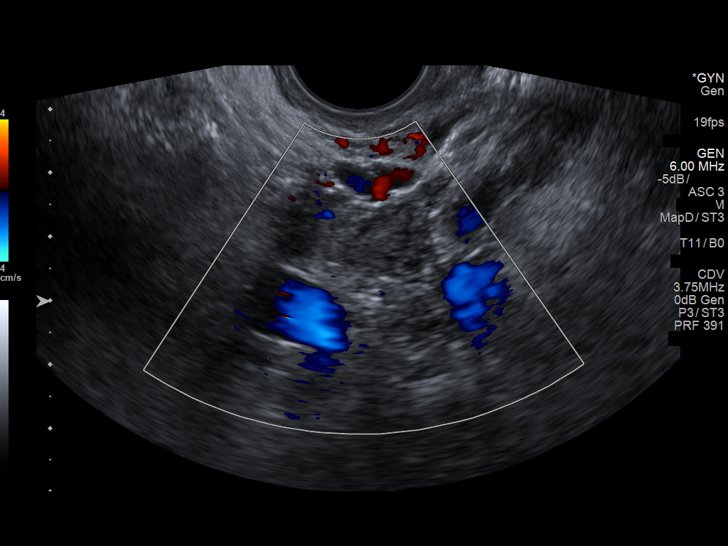
[im 54/72]
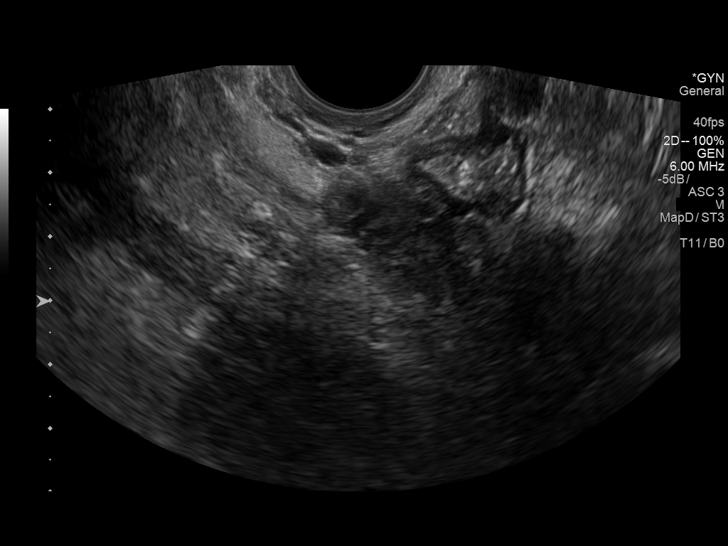
[im 60/72]
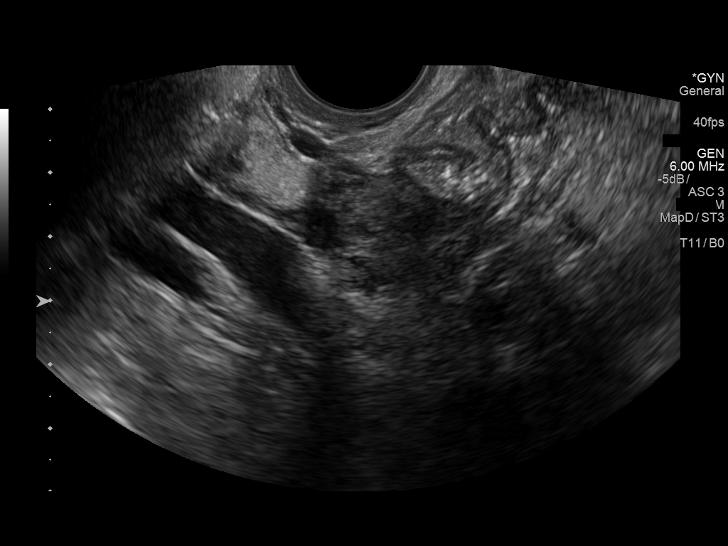
[im 66/72]
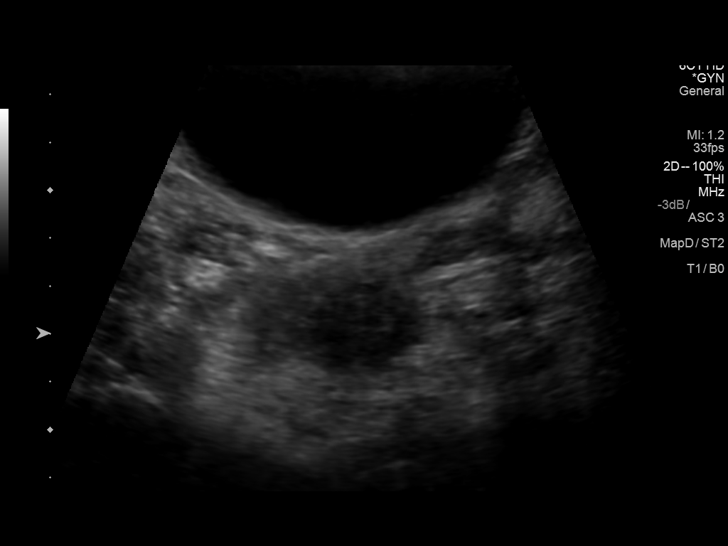
[im 72/72]
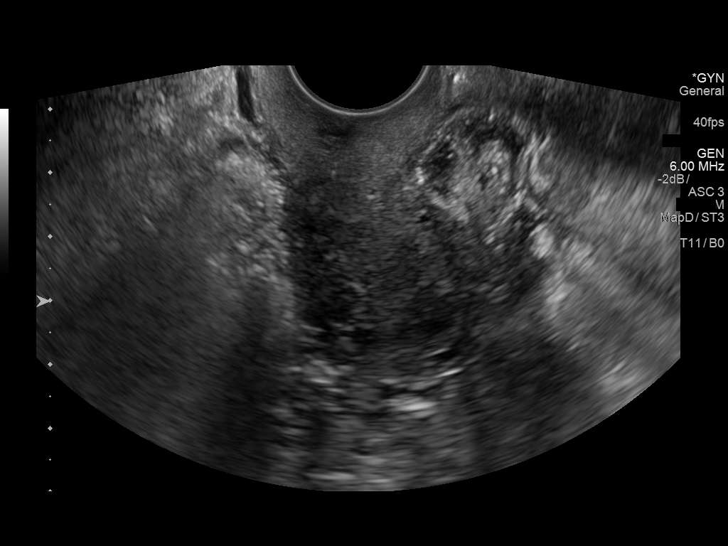

[14 of 25 positions shown; findings below may reference images not displayed]

FINDINGS: Uterus

Measurements: 6.1 x 2.7 x 4.9 cm. Small fundal fibroids are noted.
The right-sided fibroid measures 1.5 x 1.2 x 1.5 cm and a left-sided
fibroid measures 1.7 x 1.5 x 1.7 cm.

Endometrium

Thickness: 3.5 mm.  No focal abnormality visualized.

Right ovary

Measurements: 2.4 x 1.4 x 1.4 cm. Normal appearance/no adnexal mass.

Left ovary

Measurements: 2.0 x 1.4 x 1.6 cm. Normal appearance/no adnexal mass.

Other findings

Trace free pelvic fluid.
IMPRESSION: 1. Small fundal fibroids.
2. Normal endometrium.
3. Normal ovaries.
4. Trace free pelvic fluid.

## 2018-01-27 ENCOUNTER — Ambulatory Visit
Admission: RE | Admit: 2018-01-27 | Discharge: 2018-01-27 | Disposition: A | Discharge status: Home / Self Care | Payer: Medicare Other | Source: Ambulatory Visit | Attending: Internal Medicine | Admitting: Internal Medicine

## 2018-01-27 DIAGNOSIS — Z1231 Encounter for screening mammogram for malignant neoplasm of breast: Secondary | ICD-10-CM | POA: Diagnosis not present

## 2018-02-07 DIAGNOSIS — Z01419 Encounter for gynecological examination (general) (routine) without abnormal findings: Secondary | ICD-10-CM | POA: Diagnosis not present

## 2018-02-08 DIAGNOSIS — Z681 Body mass index (BMI) 19 or less, adult: Secondary | ICD-10-CM | POA: Diagnosis not present

## 2018-02-08 DIAGNOSIS — Z23 Encounter for immunization: Secondary | ICD-10-CM | POA: Diagnosis not present

## 2018-02-08 DIAGNOSIS — M810 Age-related osteoporosis without current pathological fracture: Secondary | ICD-10-CM | POA: Diagnosis not present

## 2018-02-10 ENCOUNTER — Other Ambulatory Visit: Payer: Medicare Other

## 2018-02-10 ENCOUNTER — Other Ambulatory Visit: Payer: Self-pay | Admitting: Internal Medicine

## 2018-02-10 DIAGNOSIS — E2839 Other primary ovarian failure: Secondary | ICD-10-CM

## 2018-02-22 ENCOUNTER — Ambulatory Visit
Admission: RE | Admit: 2018-02-22 | Discharge: 2018-02-22 | Disposition: A | Discharge status: Home / Self Care | Payer: Medicare Other | Source: Ambulatory Visit | Attending: Internal Medicine | Admitting: Internal Medicine

## 2018-02-22 DIAGNOSIS — Z780 Asymptomatic menopausal state: Secondary | ICD-10-CM | POA: Diagnosis not present

## 2018-02-22 DIAGNOSIS — M85852 Other specified disorders of bone density and structure, left thigh: Secondary | ICD-10-CM | POA: Diagnosis not present

## 2018-02-22 DIAGNOSIS — E2839 Other primary ovarian failure: Secondary | ICD-10-CM

## 2018-03-03 DIAGNOSIS — M8580 Other specified disorders of bone density and structure, unspecified site: Secondary | ICD-10-CM | POA: Diagnosis not present

## 2018-03-07 DIAGNOSIS — H2513 Age-related nuclear cataract, bilateral: Secondary | ICD-10-CM | POA: Diagnosis not present

## 2018-03-07 DIAGNOSIS — H04123 Dry eye syndrome of bilateral lacrimal glands: Secondary | ICD-10-CM | POA: Diagnosis not present

## 2018-03-28 DIAGNOSIS — N200 Calculus of kidney: Secondary | ICD-10-CM | POA: Diagnosis not present

## 2018-04-06 DIAGNOSIS — M810 Age-related osteoporosis without current pathological fracture: Secondary | ICD-10-CM | POA: Diagnosis not present

## 2018-04-06 DIAGNOSIS — E7849 Other hyperlipidemia: Secondary | ICD-10-CM | POA: Diagnosis not present

## 2018-04-06 DIAGNOSIS — R82998 Other abnormal findings in urine: Secondary | ICD-10-CM | POA: Diagnosis not present

## 2018-04-13 DIAGNOSIS — M810 Age-related osteoporosis without current pathological fracture: Secondary | ICD-10-CM | POA: Diagnosis not present

## 2018-04-13 DIAGNOSIS — Z23 Encounter for immunization: Secondary | ICD-10-CM | POA: Diagnosis not present

## 2018-04-13 DIAGNOSIS — F419 Anxiety disorder, unspecified: Secondary | ICD-10-CM | POA: Diagnosis not present

## 2018-04-13 DIAGNOSIS — E7849 Other hyperlipidemia: Secondary | ICD-10-CM | POA: Diagnosis not present

## 2018-04-13 DIAGNOSIS — J3089 Other allergic rhinitis: Secondary | ICD-10-CM | POA: Diagnosis not present

## 2018-04-13 DIAGNOSIS — G43909 Migraine, unspecified, not intractable, without status migrainosus: Secondary | ICD-10-CM | POA: Diagnosis not present

## 2018-04-13 DIAGNOSIS — Z8249 Family history of ischemic heart disease and other diseases of the circulatory system: Secondary | ICD-10-CM | POA: Diagnosis not present

## 2018-04-13 DIAGNOSIS — Z1389 Encounter for screening for other disorder: Secondary | ICD-10-CM | POA: Diagnosis not present

## 2018-04-13 DIAGNOSIS — Z6820 Body mass index (BMI) 20.0-20.9, adult: Secondary | ICD-10-CM | POA: Diagnosis not present

## 2018-04-13 DIAGNOSIS — N200 Calculus of kidney: Secondary | ICD-10-CM | POA: Diagnosis not present

## 2018-04-13 DIAGNOSIS — G4709 Other insomnia: Secondary | ICD-10-CM | POA: Diagnosis not present

## 2018-04-13 DIAGNOSIS — Z0000 Encounter for general adult medical examination without abnormal findings: Secondary | ICD-10-CM | POA: Diagnosis not present

## 2018-06-27 DIAGNOSIS — D225 Melanocytic nevi of trunk: Secondary | ICD-10-CM | POA: Diagnosis not present

## 2018-06-27 DIAGNOSIS — L720 Epidermal cyst: Secondary | ICD-10-CM | POA: Diagnosis not present

## 2018-06-27 DIAGNOSIS — L738 Other specified follicular disorders: Secondary | ICD-10-CM | POA: Diagnosis not present

## 2018-06-27 DIAGNOSIS — L603 Nail dystrophy: Secondary | ICD-10-CM | POA: Diagnosis not present

## 2018-06-27 DIAGNOSIS — Z85828 Personal history of other malignant neoplasm of skin: Secondary | ICD-10-CM | POA: Diagnosis not present

## 2018-06-27 DIAGNOSIS — L821 Other seborrheic keratosis: Secondary | ICD-10-CM | POA: Diagnosis not present

## 2018-09-26 DIAGNOSIS — L821 Other seborrheic keratosis: Secondary | ICD-10-CM | POA: Diagnosis not present

## 2018-09-26 DIAGNOSIS — Z85828 Personal history of other malignant neoplasm of skin: Secondary | ICD-10-CM | POA: Diagnosis not present

## 2018-09-26 DIAGNOSIS — L720 Epidermal cyst: Secondary | ICD-10-CM | POA: Diagnosis not present

## 2018-11-07 ENCOUNTER — Other Ambulatory Visit: Payer: Self-pay | Admitting: Internal Medicine

## 2018-11-07 DIAGNOSIS — M810 Age-related osteoporosis without current pathological fracture: Secondary | ICD-10-CM

## 2019-02-11 DIAGNOSIS — Z23 Encounter for immunization: Secondary | ICD-10-CM | POA: Diagnosis not present

## 2019-02-16 DIAGNOSIS — Z124 Encounter for screening for malignant neoplasm of cervix: Secondary | ICD-10-CM | POA: Diagnosis not present

## 2019-02-16 DIAGNOSIS — Z01419 Encounter for gynecological examination (general) (routine) without abnormal findings: Secondary | ICD-10-CM | POA: Diagnosis not present

## 2019-02-16 DIAGNOSIS — Z1231 Encounter for screening mammogram for malignant neoplasm of breast: Secondary | ICD-10-CM | POA: Diagnosis not present

## 2019-02-16 DIAGNOSIS — Z6820 Body mass index (BMI) 20.0-20.9, adult: Secondary | ICD-10-CM | POA: Diagnosis not present

## 2019-02-27 ENCOUNTER — Other Ambulatory Visit: Payer: Medicare Other

## 2019-02-28 ENCOUNTER — Other Ambulatory Visit: Payer: Self-pay

## 2019-02-28 ENCOUNTER — Ambulatory Visit
Admission: RE | Admit: 2019-02-28 | Discharge: 2019-02-28 | Disposition: A | Discharge status: Home / Self Care | Payer: Medicare Other | Source: Ambulatory Visit | Attending: Internal Medicine | Admitting: Internal Medicine

## 2019-02-28 DIAGNOSIS — M810 Age-related osteoporosis without current pathological fracture: Secondary | ICD-10-CM

## 2019-02-28 DIAGNOSIS — Z780 Asymptomatic menopausal state: Secondary | ICD-10-CM | POA: Diagnosis not present

## 2019-02-28 DIAGNOSIS — Z7983 Long term (current) use of bisphosphonates: Secondary | ICD-10-CM | POA: Diagnosis not present

## 2019-03-05 IMAGING — MG 2D DIGITAL SCREENING BILATERAL MAMMOGRAM WITH CAD AND ADJUNCT TO
9 of 12 series · 9 of 28 positions shown · non-contrast
Comparison: Previous exam(s).

CLINICAL DATA: Screening.

EXAM:
2D DIGITAL SCREENING BILATERAL MAMMOGRAM WITH CAD AND ADJUNCT TOMO

[L MLO synth-2D]
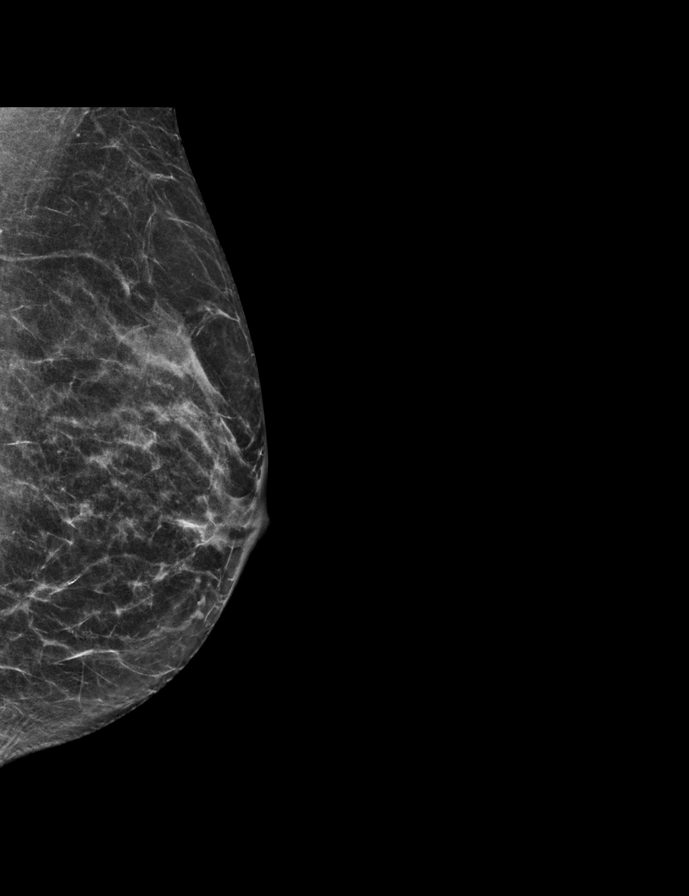

[R CC synth-2D]
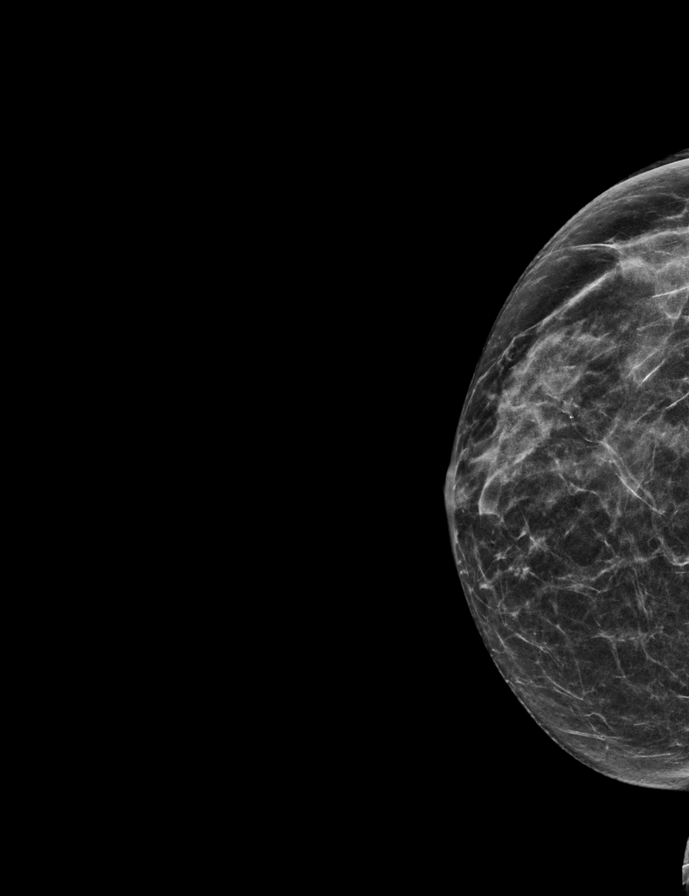

[L CC synth-2D]
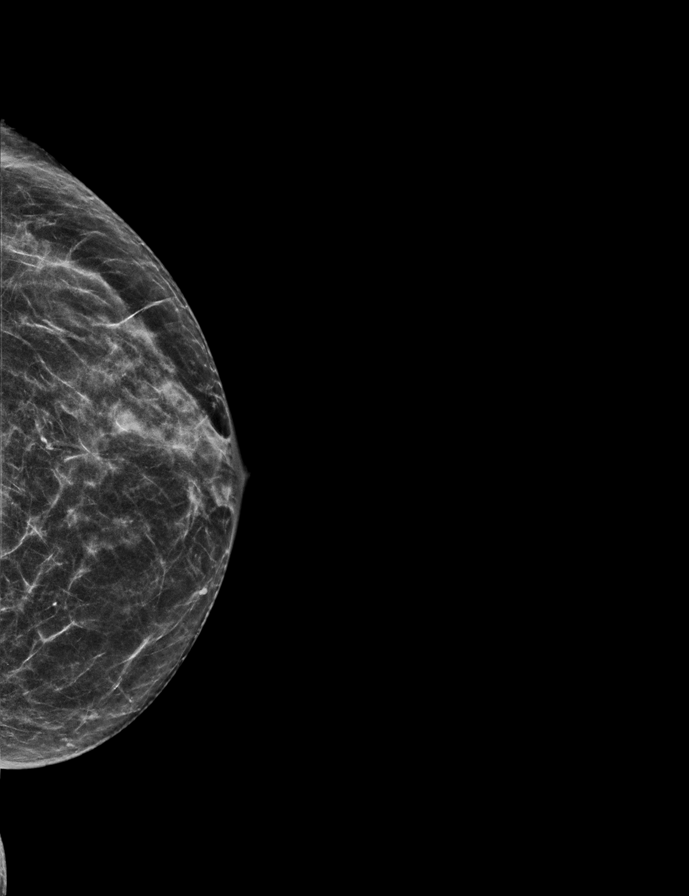

[L MLO]
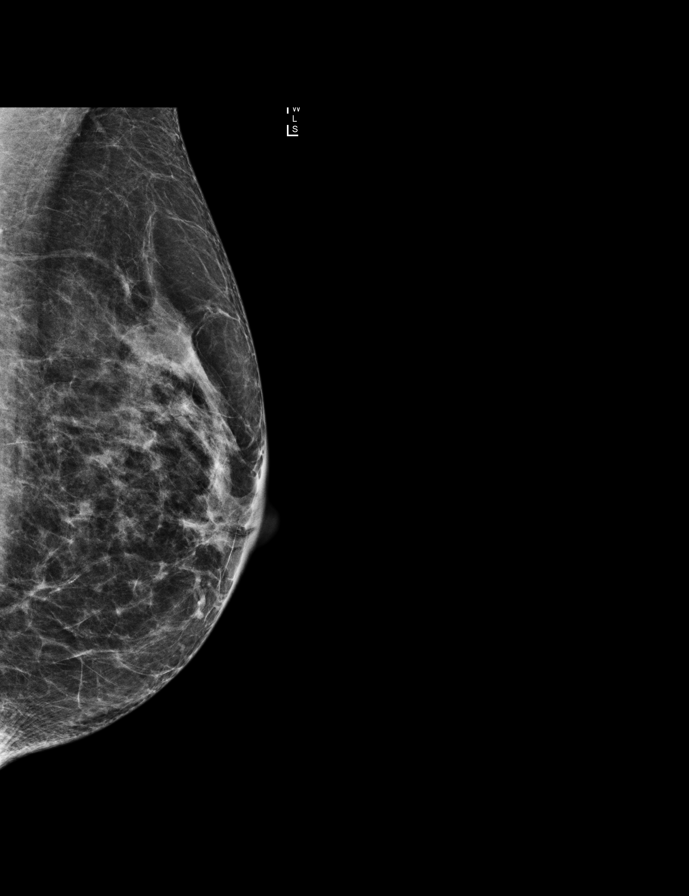

[R CC]
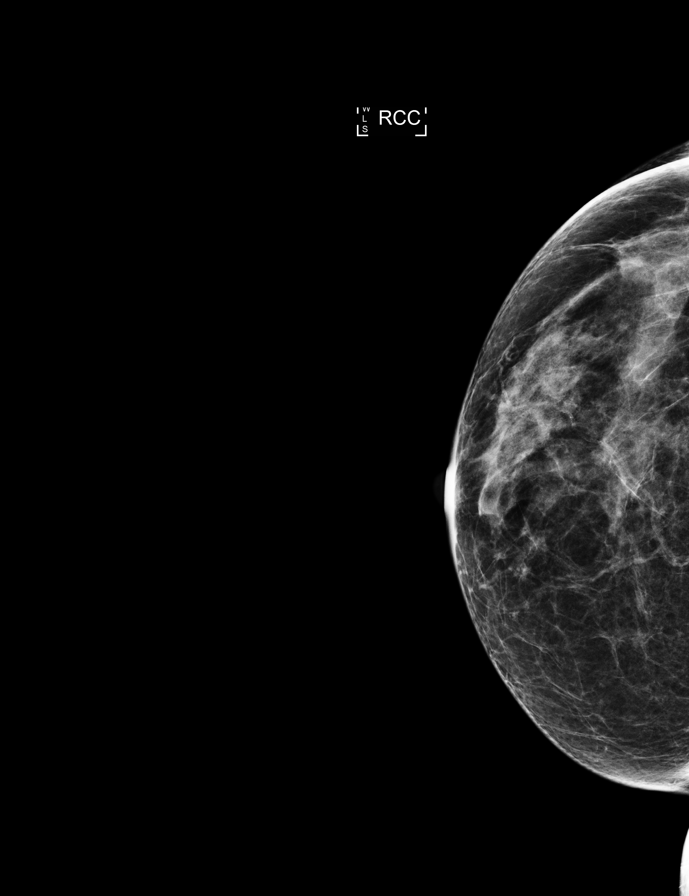

[R MLO]
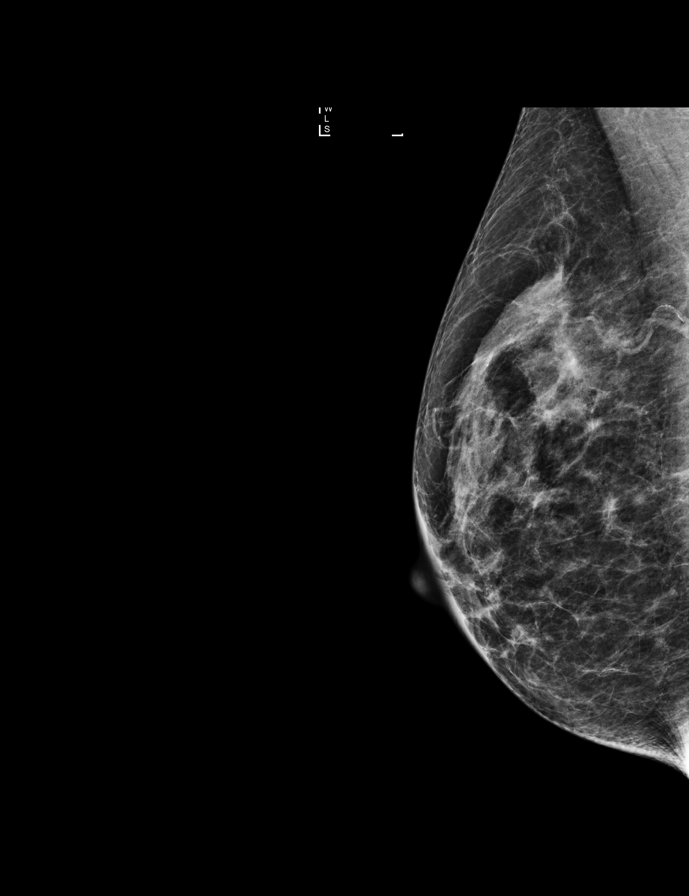

[L CC]
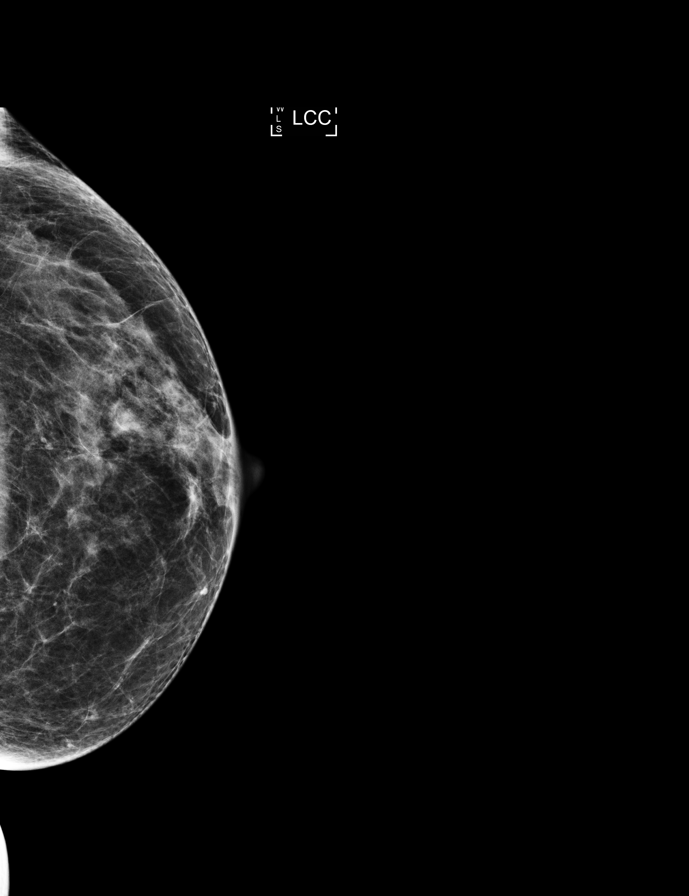

[R MLO synth-2D]
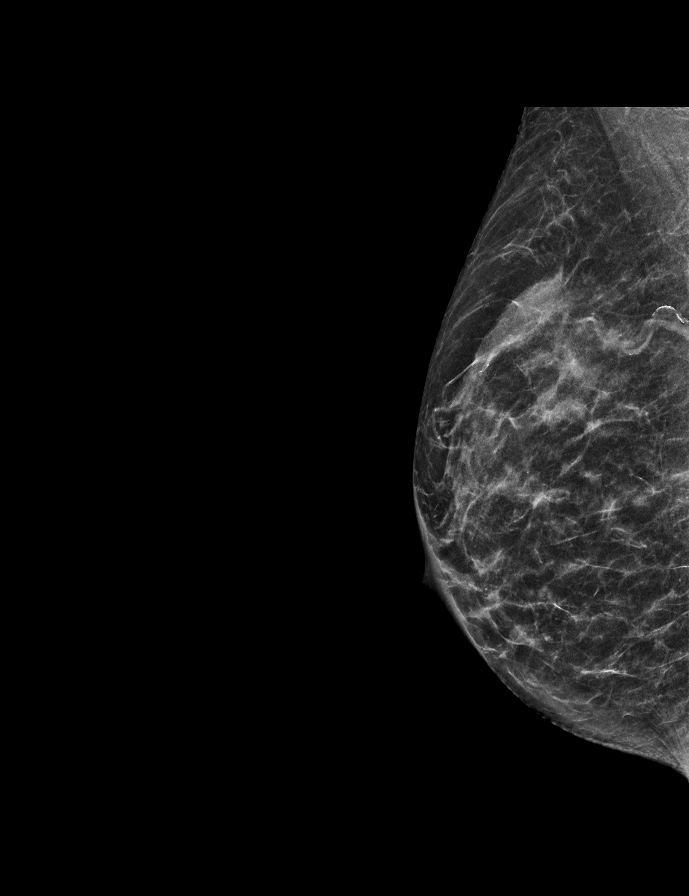

[R CC tomo · tomo slice 24/47.0]
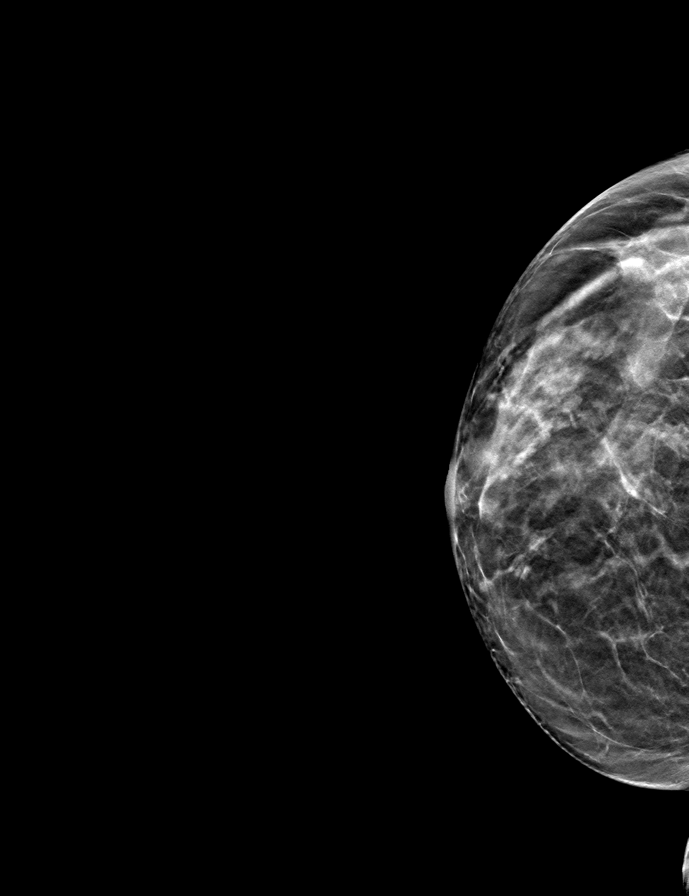

[9 of 28 positions shown; findings below may reference images not displayed]

ACR Breast Density Category c: The breast tissue is heterogeneously
dense, which may obscure small masses.
FINDINGS: There are no findings suspicious for malignancy. Images were
processed with CAD.
IMPRESSION: No mammographic evidence of malignancy. A result letter of this
screening mammogram will be mailed directly to the patient.

RECOMMENDATION:
Screening mammogram in one year. (Code:TN-0-K4T)

BI-RADS CATEGORY  1: Negative.

## 2019-03-14 DIAGNOSIS — H04123 Dry eye syndrome of bilateral lacrimal glands: Secondary | ICD-10-CM | POA: Diagnosis not present

## 2019-03-14 DIAGNOSIS — H5213 Myopia, bilateral: Secondary | ICD-10-CM | POA: Diagnosis not present

## 2019-03-14 DIAGNOSIS — H2513 Age-related nuclear cataract, bilateral: Secondary | ICD-10-CM | POA: Diagnosis not present

## 2019-03-16 DIAGNOSIS — M810 Age-related osteoporosis without current pathological fracture: Secondary | ICD-10-CM | POA: Diagnosis not present

## 2019-04-21 DIAGNOSIS — R946 Abnormal results of thyroid function studies: Secondary | ICD-10-CM | POA: Diagnosis not present

## 2019-04-21 DIAGNOSIS — R82998 Other abnormal findings in urine: Secondary | ICD-10-CM | POA: Diagnosis not present

## 2019-04-21 DIAGNOSIS — M810 Age-related osteoporosis without current pathological fracture: Secondary | ICD-10-CM | POA: Diagnosis not present

## 2019-04-21 DIAGNOSIS — E7849 Other hyperlipidemia: Secondary | ICD-10-CM | POA: Diagnosis not present

## 2019-05-03 DIAGNOSIS — F419 Anxiety disorder, unspecified: Secondary | ICD-10-CM | POA: Diagnosis not present

## 2019-05-03 DIAGNOSIS — M810 Age-related osteoporosis without current pathological fracture: Secondary | ICD-10-CM | POA: Diagnosis not present

## 2019-05-03 DIAGNOSIS — N200 Calculus of kidney: Secondary | ICD-10-CM | POA: Diagnosis not present

## 2019-05-03 DIAGNOSIS — J309 Allergic rhinitis, unspecified: Secondary | ICD-10-CM | POA: Diagnosis not present

## 2019-05-03 DIAGNOSIS — Z8249 Family history of ischemic heart disease and other diseases of the circulatory system: Secondary | ICD-10-CM | POA: Diagnosis not present

## 2019-05-03 DIAGNOSIS — E785 Hyperlipidemia, unspecified: Secondary | ICD-10-CM | POA: Diagnosis not present

## 2019-05-03 DIAGNOSIS — Z1331 Encounter for screening for depression: Secondary | ICD-10-CM | POA: Diagnosis not present

## 2019-05-03 DIAGNOSIS — G4700 Insomnia, unspecified: Secondary | ICD-10-CM | POA: Diagnosis not present

## 2019-05-03 DIAGNOSIS — Z0000 Encounter for general adult medical examination without abnormal findings: Secondary | ICD-10-CM | POA: Diagnosis not present

## 2019-05-03 DIAGNOSIS — G43909 Migraine, unspecified, not intractable, without status migrainosus: Secondary | ICD-10-CM | POA: Diagnosis not present

## 2019-06-13 ENCOUNTER — Other Ambulatory Visit: Payer: Self-pay | Admitting: Internal Medicine

## 2019-06-13 DIAGNOSIS — Z8249 Family history of ischemic heart disease and other diseases of the circulatory system: Secondary | ICD-10-CM

## 2019-06-13 DIAGNOSIS — E785 Hyperlipidemia, unspecified: Secondary | ICD-10-CM

## 2019-06-23 ENCOUNTER — Ambulatory Visit
Admission: RE | Admit: 2019-06-23 | Discharge: 2019-06-23 | Disposition: A | Discharge status: Home / Self Care | Payer: No Typology Code available for payment source | Source: Ambulatory Visit | Attending: Internal Medicine | Admitting: Internal Medicine

## 2019-06-23 DIAGNOSIS — Z8249 Family history of ischemic heart disease and other diseases of the circulatory system: Secondary | ICD-10-CM

## 2019-06-23 DIAGNOSIS — E785 Hyperlipidemia, unspecified: Secondary | ICD-10-CM

## 2019-08-15 DIAGNOSIS — L821 Other seborrheic keratosis: Secondary | ICD-10-CM | POA: Diagnosis not present

## 2019-08-15 DIAGNOSIS — D225 Melanocytic nevi of trunk: Secondary | ICD-10-CM | POA: Diagnosis not present

## 2019-08-15 DIAGNOSIS — L738 Other specified follicular disorders: Secondary | ICD-10-CM | POA: Diagnosis not present

## 2019-08-15 DIAGNOSIS — L720 Epidermal cyst: Secondary | ICD-10-CM | POA: Diagnosis not present

## 2019-08-15 DIAGNOSIS — D2271 Melanocytic nevi of right lower limb, including hip: Secondary | ICD-10-CM | POA: Diagnosis not present

## 2019-08-15 DIAGNOSIS — K130 Diseases of lips: Secondary | ICD-10-CM | POA: Diagnosis not present

## 2019-08-15 DIAGNOSIS — Z85828 Personal history of other malignant neoplasm of skin: Secondary | ICD-10-CM | POA: Diagnosis not present

## 2019-08-15 DIAGNOSIS — D2372 Other benign neoplasm of skin of left lower limb, including hip: Secondary | ICD-10-CM | POA: Diagnosis not present

## 2019-10-31 DIAGNOSIS — L570 Actinic keratosis: Secondary | ICD-10-CM | POA: Diagnosis not present

## 2019-10-31 DIAGNOSIS — Z85828 Personal history of other malignant neoplasm of skin: Secondary | ICD-10-CM | POA: Diagnosis not present

## 2020-01-17 DIAGNOSIS — Z23 Encounter for immunization: Secondary | ICD-10-CM | POA: Diagnosis not present

## 2020-02-07 ENCOUNTER — Other Ambulatory Visit: Payer: Self-pay | Admitting: Internal Medicine

## 2020-02-07 DIAGNOSIS — M810 Age-related osteoporosis without current pathological fracture: Secondary | ICD-10-CM

## 2020-02-28 DIAGNOSIS — Z23 Encounter for immunization: Secondary | ICD-10-CM | POA: Diagnosis not present

## 2020-03-12 ENCOUNTER — Other Ambulatory Visit: Payer: Self-pay

## 2020-03-12 ENCOUNTER — Ambulatory Visit
Admission: RE | Admit: 2020-03-12 | Discharge: 2020-03-12 | Disposition: A | Discharge status: Home / Self Care | Payer: Medicare Other | Source: Ambulatory Visit | Attending: Internal Medicine | Admitting: Internal Medicine

## 2020-03-12 DIAGNOSIS — Z780 Asymptomatic menopausal state: Secondary | ICD-10-CM | POA: Diagnosis not present

## 2020-03-12 DIAGNOSIS — M8589 Other specified disorders of bone density and structure, multiple sites: Secondary | ICD-10-CM | POA: Diagnosis not present

## 2020-03-12 DIAGNOSIS — M810 Age-related osteoporosis without current pathological fracture: Secondary | ICD-10-CM

## 2020-03-15 DIAGNOSIS — H524 Presbyopia: Secondary | ICD-10-CM | POA: Diagnosis not present

## 2020-03-15 DIAGNOSIS — H52223 Regular astigmatism, bilateral: Secondary | ICD-10-CM | POA: Diagnosis not present

## 2020-03-15 DIAGNOSIS — H04123 Dry eye syndrome of bilateral lacrimal glands: Secondary | ICD-10-CM | POA: Diagnosis not present

## 2020-03-15 DIAGNOSIS — H5213 Myopia, bilateral: Secondary | ICD-10-CM | POA: Diagnosis not present

## 2020-03-15 DIAGNOSIS — H25813 Combined forms of age-related cataract, bilateral: Secondary | ICD-10-CM | POA: Diagnosis not present

## 2020-03-15 DIAGNOSIS — H43813 Vitreous degeneration, bilateral: Secondary | ICD-10-CM | POA: Diagnosis not present

## 2020-03-21 DIAGNOSIS — M8589 Other specified disorders of bone density and structure, multiple sites: Secondary | ICD-10-CM | POA: Diagnosis not present

## 2020-04-22 DIAGNOSIS — E785 Hyperlipidemia, unspecified: Secondary | ICD-10-CM | POA: Diagnosis not present

## 2020-04-22 DIAGNOSIS — M810 Age-related osteoporosis without current pathological fracture: Secondary | ICD-10-CM | POA: Diagnosis not present

## 2020-05-03 DIAGNOSIS — Z0000 Encounter for general adult medical examination without abnormal findings: Secondary | ICD-10-CM | POA: Diagnosis not present

## 2020-05-03 DIAGNOSIS — N200 Calculus of kidney: Secondary | ICD-10-CM | POA: Diagnosis not present

## 2020-05-03 DIAGNOSIS — R82998 Other abnormal findings in urine: Secondary | ICD-10-CM | POA: Diagnosis not present

## 2020-05-03 DIAGNOSIS — F419 Anxiety disorder, unspecified: Secondary | ICD-10-CM | POA: Diagnosis not present

## 2020-05-03 DIAGNOSIS — G43909 Migraine, unspecified, not intractable, without status migrainosus: Secondary | ICD-10-CM | POA: Diagnosis not present

## 2020-05-03 DIAGNOSIS — E785 Hyperlipidemia, unspecified: Secondary | ICD-10-CM | POA: Diagnosis not present

## 2020-05-03 DIAGNOSIS — Z8249 Family history of ischemic heart disease and other diseases of the circulatory system: Secondary | ICD-10-CM | POA: Diagnosis not present

## 2020-05-03 DIAGNOSIS — J309 Allergic rhinitis, unspecified: Secondary | ICD-10-CM | POA: Diagnosis not present

## 2020-05-03 DIAGNOSIS — M810 Age-related osteoporosis without current pathological fracture: Secondary | ICD-10-CM | POA: Diagnosis not present

## 2020-05-03 DIAGNOSIS — G4700 Insomnia, unspecified: Secondary | ICD-10-CM | POA: Diagnosis not present

## 2020-08-07 DIAGNOSIS — Z1231 Encounter for screening mammogram for malignant neoplasm of breast: Secondary | ICD-10-CM | POA: Diagnosis not present

## 2020-08-07 DIAGNOSIS — Z01419 Encounter for gynecological examination (general) (routine) without abnormal findings: Secondary | ICD-10-CM | POA: Diagnosis not present

## 2020-08-07 DIAGNOSIS — Z6820 Body mass index (BMI) 20.0-20.9, adult: Secondary | ICD-10-CM | POA: Diagnosis not present

## 2020-08-07 DIAGNOSIS — Z7689 Persons encountering health services in other specified circumstances: Secondary | ICD-10-CM | POA: Diagnosis not present

## 2020-08-28 DIAGNOSIS — Z23 Encounter for immunization: Secondary | ICD-10-CM | POA: Diagnosis not present

## 2020-09-03 DIAGNOSIS — L570 Actinic keratosis: Secondary | ICD-10-CM | POA: Diagnosis not present

## 2020-09-03 DIAGNOSIS — D225 Melanocytic nevi of trunk: Secondary | ICD-10-CM | POA: Diagnosis not present

## 2020-09-03 DIAGNOSIS — L738 Other specified follicular disorders: Secondary | ICD-10-CM | POA: Diagnosis not present

## 2020-09-03 DIAGNOSIS — Z85828 Personal history of other malignant neoplasm of skin: Secondary | ICD-10-CM | POA: Diagnosis not present

## 2020-09-03 DIAGNOSIS — D1801 Hemangioma of skin and subcutaneous tissue: Secondary | ICD-10-CM | POA: Diagnosis not present

## 2020-09-03 DIAGNOSIS — L821 Other seborrheic keratosis: Secondary | ICD-10-CM | POA: Diagnosis not present

## 2021-02-04 DIAGNOSIS — L738 Other specified follicular disorders: Secondary | ICD-10-CM | POA: Diagnosis not present

## 2021-02-04 DIAGNOSIS — D692 Other nonthrombocytopenic purpura: Secondary | ICD-10-CM | POA: Diagnosis not present

## 2021-02-04 DIAGNOSIS — Z85828 Personal history of other malignant neoplasm of skin: Secondary | ICD-10-CM | POA: Diagnosis not present

## 2021-02-04 DIAGNOSIS — L570 Actinic keratosis: Secondary | ICD-10-CM | POA: Diagnosis not present

## 2021-02-25 DIAGNOSIS — Z23 Encounter for immunization: Secondary | ICD-10-CM | POA: Diagnosis not present

## 2021-03-19 DIAGNOSIS — M8589 Other specified disorders of bone density and structure, multiple sites: Secondary | ICD-10-CM | POA: Diagnosis not present

## 2021-04-04 ENCOUNTER — Other Ambulatory Visit: Payer: Self-pay | Admitting: Family Medicine

## 2021-04-04 DIAGNOSIS — M8589 Other specified disorders of bone density and structure, multiple sites: Secondary | ICD-10-CM

## 2021-04-18 DIAGNOSIS — H524 Presbyopia: Secondary | ICD-10-CM | POA: Diagnosis not present

## 2021-04-18 DIAGNOSIS — H43813 Vitreous degeneration, bilateral: Secondary | ICD-10-CM | POA: Diagnosis not present

## 2021-04-18 DIAGNOSIS — H52223 Regular astigmatism, bilateral: Secondary | ICD-10-CM | POA: Diagnosis not present

## 2021-04-18 DIAGNOSIS — H04123 Dry eye syndrome of bilateral lacrimal glands: Secondary | ICD-10-CM | POA: Diagnosis not present

## 2021-04-18 DIAGNOSIS — H25813 Combined forms of age-related cataract, bilateral: Secondary | ICD-10-CM | POA: Diagnosis not present

## 2021-04-18 DIAGNOSIS — H5213 Myopia, bilateral: Secondary | ICD-10-CM | POA: Diagnosis not present

## 2021-04-28 DIAGNOSIS — Z85828 Personal history of other malignant neoplasm of skin: Secondary | ICD-10-CM | POA: Diagnosis not present

## 2021-04-28 DIAGNOSIS — L570 Actinic keratosis: Secondary | ICD-10-CM | POA: Diagnosis not present

## 2021-04-28 DIAGNOSIS — L738 Other specified follicular disorders: Secondary | ICD-10-CM | POA: Diagnosis not present

## 2021-05-05 DIAGNOSIS — R946 Abnormal results of thyroid function studies: Secondary | ICD-10-CM | POA: Diagnosis not present

## 2021-05-05 DIAGNOSIS — E785 Hyperlipidemia, unspecified: Secondary | ICD-10-CM | POA: Diagnosis not present

## 2021-05-05 DIAGNOSIS — M810 Age-related osteoporosis without current pathological fracture: Secondary | ICD-10-CM | POA: Diagnosis not present

## 2021-05-12 DIAGNOSIS — G43909 Migraine, unspecified, not intractable, without status migrainosus: Secondary | ICD-10-CM | POA: Diagnosis not present

## 2021-05-12 DIAGNOSIS — E785 Hyperlipidemia, unspecified: Secondary | ICD-10-CM | POA: Diagnosis not present

## 2021-05-12 DIAGNOSIS — G4700 Insomnia, unspecified: Secondary | ICD-10-CM | POA: Diagnosis not present

## 2021-05-12 DIAGNOSIS — Z8249 Family history of ischemic heart disease and other diseases of the circulatory system: Secondary | ICD-10-CM | POA: Diagnosis not present

## 2021-05-12 DIAGNOSIS — R82998 Other abnormal findings in urine: Secondary | ICD-10-CM | POA: Diagnosis not present

## 2021-05-12 DIAGNOSIS — Z23 Encounter for immunization: Secondary | ICD-10-CM | POA: Diagnosis not present

## 2021-05-12 DIAGNOSIS — F419 Anxiety disorder, unspecified: Secondary | ICD-10-CM | POA: Diagnosis not present

## 2021-05-12 DIAGNOSIS — Z0000 Encounter for general adult medical examination without abnormal findings: Secondary | ICD-10-CM | POA: Diagnosis not present

## 2021-05-12 DIAGNOSIS — M810 Age-related osteoporosis without current pathological fracture: Secondary | ICD-10-CM | POA: Diagnosis not present

## 2021-08-07 DIAGNOSIS — Z6821 Body mass index (BMI) 21.0-21.9, adult: Secondary | ICD-10-CM | POA: Diagnosis not present

## 2021-08-07 DIAGNOSIS — Z1231 Encounter for screening mammogram for malignant neoplasm of breast: Secondary | ICD-10-CM | POA: Diagnosis not present

## 2021-08-07 DIAGNOSIS — Z124 Encounter for screening for malignant neoplasm of cervix: Secondary | ICD-10-CM | POA: Diagnosis not present

## 2021-10-24 DIAGNOSIS — Z23 Encounter for immunization: Secondary | ICD-10-CM | POA: Diagnosis not present

## 2021-10-28 DIAGNOSIS — Z85828 Personal history of other malignant neoplasm of skin: Secondary | ICD-10-CM | POA: Diagnosis not present

## 2021-10-28 DIAGNOSIS — L570 Actinic keratosis: Secondary | ICD-10-CM | POA: Diagnosis not present

## 2021-12-30 DIAGNOSIS — L723 Sebaceous cyst: Secondary | ICD-10-CM | POA: Diagnosis not present

## 2021-12-30 DIAGNOSIS — Z85828 Personal history of other malignant neoplasm of skin: Secondary | ICD-10-CM | POA: Diagnosis not present

## 2021-12-30 DIAGNOSIS — L821 Other seborrheic keratosis: Secondary | ICD-10-CM | POA: Diagnosis not present

## 2021-12-30 DIAGNOSIS — L570 Actinic keratosis: Secondary | ICD-10-CM | POA: Diagnosis not present

## 2021-12-30 DIAGNOSIS — L738 Other specified follicular disorders: Secondary | ICD-10-CM | POA: Diagnosis not present

## 2022-02-26 DIAGNOSIS — Z23 Encounter for immunization: Secondary | ICD-10-CM | POA: Diagnosis not present

## 2022-03-13 ENCOUNTER — Ambulatory Visit
Admission: RE | Admit: 2022-03-13 | Discharge: 2022-03-13 | Disposition: A | Discharge status: Home / Self Care | Payer: Medicare Other | Source: Ambulatory Visit | Attending: Family Medicine | Admitting: Family Medicine

## 2022-03-13 DIAGNOSIS — M85851 Other specified disorders of bone density and structure, right thigh: Secondary | ICD-10-CM | POA: Diagnosis not present

## 2022-03-13 DIAGNOSIS — M810 Age-related osteoporosis without current pathological fracture: Secondary | ICD-10-CM | POA: Diagnosis not present

## 2022-03-13 DIAGNOSIS — Z23 Encounter for immunization: Secondary | ICD-10-CM | POA: Diagnosis not present

## 2022-03-13 DIAGNOSIS — M8589 Other specified disorders of bone density and structure, multiple sites: Secondary | ICD-10-CM

## 2022-03-13 DIAGNOSIS — Z780 Asymptomatic menopausal state: Secondary | ICD-10-CM | POA: Diagnosis not present

## 2022-03-19 DIAGNOSIS — M8589 Other specified disorders of bone density and structure, multiple sites: Secondary | ICD-10-CM | POA: Diagnosis not present

## 2022-04-15 DIAGNOSIS — H04123 Dry eye syndrome of bilateral lacrimal glands: Secondary | ICD-10-CM | POA: Diagnosis not present

## 2022-04-15 DIAGNOSIS — H353131 Nonexudative age-related macular degeneration, bilateral, early dry stage: Secondary | ICD-10-CM | POA: Diagnosis not present

## 2022-04-15 DIAGNOSIS — H43813 Vitreous degeneration, bilateral: Secondary | ICD-10-CM | POA: Diagnosis not present

## 2022-04-15 DIAGNOSIS — H25813 Combined forms of age-related cataract, bilateral: Secondary | ICD-10-CM | POA: Diagnosis not present

## 2022-05-13 ENCOUNTER — Encounter: Payer: Self-pay | Admitting: Orthopaedic Surgery

## 2022-05-13 ENCOUNTER — Ambulatory Visit (INDEPENDENT_AMBULATORY_CARE_PROVIDER_SITE_OTHER): Payer: Medicare Other | Admitting: Orthopaedic Surgery

## 2022-05-13 ENCOUNTER — Ambulatory Visit (INDEPENDENT_AMBULATORY_CARE_PROVIDER_SITE_OTHER): Payer: Medicare Other

## 2022-05-13 DIAGNOSIS — M25562 Pain in left knee: Secondary | ICD-10-CM | POA: Diagnosis not present

## 2022-05-13 NOTE — Progress Notes (Signed)
Office Visit Note   Patient: Mary Bradley           Date of Birth: [birth year]           MRN: [id number] Visit Date: 05/13/2022              Requested by: No referring provider defined for this encounter. PCP: Maisie Fus, MD (Inactive)   Assessment & Plan: Visit Diagnoses:  1. Acute pain of left knee     Plan: Debbie fell striking her left knee directly about 3 to 4 weeks ago.  Initially she had some bruising along the proximal tibia but continues to have pain with certain activities and position on the medial aspect of her knee.  There has been no effusion.  X-rays demonstrate some degenerative change in the medial compartment with small peripheral osteophytes and subchondral sclerosis with narrowing of the medial joint space.  Suspect that she is probably had an exacerbation of her arthritis.  There is always a possibility of a bone bruise would suggest Advil or Aleve and even Voltaren gel.  I will order an MRI scan  Follow-Up Instructions: Return After MRI scan left knee.   Orders:  Orders Placed This Encounter  Procedures   XR KNEE 3 VIEW LEFT   MR Knee Left w/o contrast   No orders of the defined types were placed in this encounter.     Procedures: No procedures performed   Clinical Data: No additional findings.   Subjective: Chief Complaint  Patient presents with   Left Knee - Pain  Fell 3 to 4 weeks ago landing directly on the left knee.  Developed some bruising along the proximal tibia.  Still having pain to the point of occasional activity compromise medially.  There has been no effusion.  No fever or chills.  No numbness or tingling.  HPI  Review of Systems   Objective: Vital Signs: There were no vitals taken for this visit.  Physical Exam Constitutional:      Appearance: She is well-developed.  Eyes:     Pupils: Pupils are equal, round, and reactive to light.  Pulmonary:     Effort: Pulmonary effort is normal.  Skin:    General: Skin is  warm and dry.  Neurological:     Mental Status: She is alert and oriented to person, place, and time.  Psychiatric:        Behavior: Behavior normal.     Ortho Exam left knee was not hot red warm or swollen.  No effusion.  Pain localized directly over the medial joint space.  No pain over the MCL nor any evidence of instability with varus or valgus stress.  Negative anterior drawer sign.  No patella crepitation.  Full knee extension flex well over 100 degrees.  No popliteal pain or mass.  No calf pain.  Neurologically intact.  Straight leg raise.  No pain with range of motion of right hip.  Skin intact  Specialty Comments:  No specialty comments available.  Imaging: XR KNEE 3 VIEW LEFT  Result Date: 05/13/2022 Films of the left knee were obtained in 3 projections standing.  There is some mild decrease in the medial joint space associated with very small peripheral osteophytes and subchondral sclerosis.  Lateral compartment appears to be intact and minimal degenerative change about the patellofemoral joint.  No ectopic calcification.  No acute changes.  Films are consistent with osteoarthritis    PMFS History: Patient Active Problem List  Diagnosis Date Noted   Pain in left knee 05/13/2022   History reviewed. No pertinent past medical history.  History reviewed. No pertinent family history.  History reviewed. No pertinent surgical history. Social History   Occupational History   Not on file  Tobacco Use   Smoking status: Never   Smokeless tobacco: Not on file  Substance and Sexual Activity   Alcohol use: Not on file   Drug use: Not on file   Sexual activity: Not on file     Garald Balding, MD   Note - This record has been created using [employer].  Chart creation errors have been sought, but may not always  have been located. Such creation errors do not reflect on  the standard of medical care.

## 2022-05-20 ENCOUNTER — Ambulatory Visit
Admission: RE | Admit: 2022-05-20 | Discharge: 2022-05-20 | Disposition: A | Discharge status: Home / Self Care | Payer: Medicare Other | Source: Ambulatory Visit | Attending: Physician Assistant | Admitting: Physician Assistant

## 2022-05-20 DIAGNOSIS — M25562 Pain in left knee: Secondary | ICD-10-CM

## 2022-05-20 DIAGNOSIS — S83412A Sprain of medial collateral ligament of left knee, initial encounter: Secondary | ICD-10-CM | POA: Diagnosis not present

## 2022-05-20 DIAGNOSIS — M1712 Unilateral primary osteoarthritis, left knee: Secondary | ICD-10-CM | POA: Diagnosis not present

## 2022-05-20 DIAGNOSIS — Z9889 Other specified postprocedural states: Secondary | ICD-10-CM | POA: Diagnosis not present

## 2022-05-29 ENCOUNTER — Ambulatory Visit (INDEPENDENT_AMBULATORY_CARE_PROVIDER_SITE_OTHER): Payer: Medicare Other | Admitting: Physician Assistant

## 2022-05-29 ENCOUNTER — Encounter: Payer: Self-pay | Admitting: Physician Assistant

## 2022-05-29 DIAGNOSIS — M25562 Pain in left knee: Secondary | ICD-10-CM | POA: Diagnosis not present

## 2022-05-29 DIAGNOSIS — M810 Age-related osteoporosis without current pathological fracture: Secondary | ICD-10-CM | POA: Diagnosis not present

## 2022-05-29 DIAGNOSIS — R946 Abnormal results of thyroid function studies: Secondary | ICD-10-CM | POA: Diagnosis not present

## 2022-05-29 DIAGNOSIS — Z1211 Encounter for screening for malignant neoplasm of colon: Secondary | ICD-10-CM | POA: Diagnosis not present

## 2022-05-29 DIAGNOSIS — E785 Hyperlipidemia, unspecified: Secondary | ICD-10-CM | POA: Diagnosis not present

## 2022-05-29 MED ORDER — BUPIVACAINE HCL 0.25 % IJ SOLN
2.0000 mL | INTRAMUSCULAR | Status: AC | PRN
  Administered 2022-05-29: 2 mL via INTRA_ARTICULAR

## 2022-05-29 MED ORDER — METHYLPREDNISOLONE ACETATE 40 MG/ML IJ SUSP
80.0000 mg | INTRAMUSCULAR | Status: AC | PRN
  Administered 2022-05-29: 80 mg via INTRA_ARTICULAR

## 2022-05-29 MED ORDER — LIDOCAINE HCL 1 % IJ SOLN
2.0000 mL | INTRAMUSCULAR | Status: AC | PRN
  Administered 2022-05-29: 2 mL

## 2022-05-29 NOTE — Progress Notes (Signed)
Office Visit Note   Patient: Mary Bradley           Date of Birth: 1951/04/11           MRN: [id number] Visit Date: 05/29/2022              Requested by: Prince Solian, MD [address] [place],  [place] [address] PCP: Prince Solian, MD  Chief Complaint  Patient presents with   Left Knee - Pain      HPI: Ms. Boch is a pleasant 71 year old woman who several weeks ago fell onto her left knee.  She has been followed by Dr. Durward Fortes.  Focus of her knee pain is been over the medial side of the knee as well of the extra-articular portion of the medial knee.  An MRI was ordered.  Dr. Gerhard Perches did review this with her by phone.  She has had history of previous meniscectomy.  No acute bony changes.  There is a high-grade tear of the medial collateral ligament.  She has both an intra-articular and extra-articular component of her pain.  She does have a knee sleeve which is not very supportive.  She has been quite busy the last couple weeks with family affairs.  Assessment & Plan: Visit Diagnoses: MCL strain                            Left knee pain  Plan: We will go forward with an intra-articular injection today to hopefully help with some of her pain and better define if most of her pain is intra-articular or extra-articular.  Will also provide her with a knee brace with stays.  She did felt a lot better once this was applied.  Can follow-up with Dr. Gerhard Perches as needed  Follow-Up Instructions: Return if symptoms worsen or fail to improve.   Ortho Exam  Patient is alert, oriented, no adenopathy, well-dressed, normal affect, normal respiratory effort. No effusion of the left knee no erythema compartments are soft and nontender.  She does have some tenderness over the posterior medial joint line as well over the MCL complex.  There is some pain with valgus stress.  She is neurovascularly intact  Imaging: No results found. No images are attached to the encounter.  Labs: No  results found for: "HGBA1C", "ESRSEDRATE", "CRP", "LABURIC", "REPTSTATUS", "GRAMSTAIN", "CULT", "LABORGA"   No results found for: "ALBUMIN", "PREALBUMIN", "CBC"  No results found for: "MG" No results found for: "VD25OH"  No results found for: "PREALBUMIN"    Latest Ref Rng & Units 11/17/2008   11:28 PM  CBC EXTENDED  WBC 4.0 - 10.5 K/uL 14.4   RBC 3.87 - 5.11 MIL/uL 4.17   Hemoglobin 12.0 - 15.0 g/dL 13.1   HCT 36.0 - 46.0 % 38.0   Platelets 150 - 400 K/uL 188   NEUT# 1.7 - 7.7 K/uL 12.8   Lymph# 0.7 - 4.0 K/uL 1.0      There is no height or weight on file to calculate BMI.  Orders:  No orders of the defined types were placed in this encounter.  No orders of the defined types were placed in this encounter.    Procedures: Large Joint Inj on 05/29/2022 11:20 AM Indications: pain and diagnostic evaluation Details: 25 G 1.5 in needle, anteromedial approach  Arthrogram: No  Medications: 80 mg methylPREDNISolone acetate 40 MG/ML; 2 mL lidocaine 1 %; 2 mL bupivacaine 0.25 % Outcome: tolerated well, no immediate complications  Procedure, treatment alternatives, risks and benefits explained, specific risks discussed. Consent was given by the patient.    Clinical Data: No additional findings.  ROS:  All other systems negative, except as noted in the HPI. Review of Systems  Objective: Vital Signs: There were no vitals taken for this visit.  Specialty Comments:  No specialty comments available.  PMFS History: Patient Active Problem List   Diagnosis Date Noted   Pain in left knee 05/13/2022   History reviewed. No pertinent past medical history.  History reviewed. No pertinent family history.  History reviewed. No pertinent surgical history. Social History   Occupational History   Not on file  Tobacco Use   Smoking status: Never   Smokeless tobacco: Not on file  Substance and Sexual Activity   Alcohol use: Not on file   Drug use: Not on file   Sexual  activity: Not on file

## 2022-06-04 DIAGNOSIS — R82998 Other abnormal findings in urine: Secondary | ICD-10-CM | POA: Diagnosis not present

## 2022-06-05 DIAGNOSIS — Z0000 Encounter for general adult medical examination without abnormal findings: Secondary | ICD-10-CM | POA: Diagnosis not present

## 2022-06-05 DIAGNOSIS — E785 Hyperlipidemia, unspecified: Secondary | ICD-10-CM | POA: Diagnosis not present

## 2022-06-05 DIAGNOSIS — G4700 Insomnia, unspecified: Secondary | ICD-10-CM | POA: Diagnosis not present

## 2022-06-05 DIAGNOSIS — H353131 Nonexudative age-related macular degeneration, bilateral, early dry stage: Secondary | ICD-10-CM | POA: Diagnosis not present

## 2022-06-05 DIAGNOSIS — N200 Calculus of kidney: Secondary | ICD-10-CM | POA: Diagnosis not present

## 2022-06-05 DIAGNOSIS — M810 Age-related osteoporosis without current pathological fracture: Secondary | ICD-10-CM | POA: Diagnosis not present

## 2022-06-05 DIAGNOSIS — F419 Anxiety disorder, unspecified: Secondary | ICD-10-CM | POA: Diagnosis not present

## 2022-06-05 DIAGNOSIS — J309 Allergic rhinitis, unspecified: Secondary | ICD-10-CM | POA: Diagnosis not present

## 2022-06-05 DIAGNOSIS — R748 Abnormal levels of other serum enzymes: Secondary | ICD-10-CM | POA: Diagnosis not present

## 2022-06-05 DIAGNOSIS — G43909 Migraine, unspecified, not intractable, without status migrainosus: Secondary | ICD-10-CM | POA: Diagnosis not present

## 2022-06-05 DIAGNOSIS — K625 Hemorrhage of anus and rectum: Secondary | ICD-10-CM | POA: Diagnosis not present

## 2022-06-05 DIAGNOSIS — Z8249 Family history of ischemic heart disease and other diseases of the circulatory system: Secondary | ICD-10-CM | POA: Diagnosis not present

## 2022-06-24 DIAGNOSIS — D2372 Other benign neoplasm of skin of left lower limb, including hip: Secondary | ICD-10-CM | POA: Diagnosis not present

## 2022-06-24 DIAGNOSIS — L821 Other seborrheic keratosis: Secondary | ICD-10-CM | POA: Diagnosis not present

## 2022-06-24 DIAGNOSIS — L720 Epidermal cyst: Secondary | ICD-10-CM | POA: Diagnosis not present

## 2022-06-24 DIAGNOSIS — Z85828 Personal history of other malignant neoplasm of skin: Secondary | ICD-10-CM | POA: Diagnosis not present

## 2022-07-16 DIAGNOSIS — E785 Hyperlipidemia, unspecified: Secondary | ICD-10-CM | POA: Diagnosis not present

## 2022-07-30 DIAGNOSIS — K625 Hemorrhage of anus and rectum: Secondary | ICD-10-CM | POA: Diagnosis not present

## 2022-07-30 DIAGNOSIS — D122 Benign neoplasm of ascending colon: Secondary | ICD-10-CM | POA: Diagnosis not present

## 2022-07-30 DIAGNOSIS — D128 Benign neoplasm of rectum: Secondary | ICD-10-CM | POA: Diagnosis not present

## 2022-08-09 DIAGNOSIS — Z23 Encounter for immunization: Secondary | ICD-10-CM | POA: Diagnosis not present

## 2022-08-17 DIAGNOSIS — Z1231 Encounter for screening mammogram for malignant neoplasm of breast: Secondary | ICD-10-CM | POA: Diagnosis not present

## 2022-12-07 DIAGNOSIS — H401230 Low-tension glaucoma, bilateral, stage unspecified: Secondary | ICD-10-CM | POA: Diagnosis not present

## 2022-12-07 DIAGNOSIS — H353112 Nonexudative age-related macular degeneration, right eye, intermediate dry stage: Secondary | ICD-10-CM | POA: Diagnosis not present

## 2022-12-07 DIAGNOSIS — H2513 Age-related nuclear cataract, bilateral: Secondary | ICD-10-CM | POA: Diagnosis not present

## 2022-12-07 DIAGNOSIS — H5213 Myopia, bilateral: Secondary | ICD-10-CM | POA: Diagnosis not present

## 2022-12-07 DIAGNOSIS — H25013 Cortical age-related cataract, bilateral: Secondary | ICD-10-CM | POA: Diagnosis not present

## 2022-12-07 DIAGNOSIS — H524 Presbyopia: Secondary | ICD-10-CM | POA: Diagnosis not present

## 2022-12-07 DIAGNOSIS — H43813 Vitreous degeneration, bilateral: Secondary | ICD-10-CM | POA: Diagnosis not present

## 2023-01-18 DIAGNOSIS — Z85828 Personal history of other malignant neoplasm of skin: Secondary | ICD-10-CM | POA: Diagnosis not present

## 2023-01-18 DIAGNOSIS — L738 Other specified follicular disorders: Secondary | ICD-10-CM | POA: Diagnosis not present

## 2023-01-18 DIAGNOSIS — L821 Other seborrheic keratosis: Secondary | ICD-10-CM | POA: Diagnosis not present

## 2023-01-19 DIAGNOSIS — H40023 Open angle with borderline findings, high risk, bilateral: Secondary | ICD-10-CM | POA: Diagnosis not present

## 2023-01-19 DIAGNOSIS — H2513 Age-related nuclear cataract, bilateral: Secondary | ICD-10-CM | POA: Diagnosis not present

## 2023-01-19 DIAGNOSIS — H25013 Cortical age-related cataract, bilateral: Secondary | ICD-10-CM | POA: Diagnosis not present

## 2023-02-11 DIAGNOSIS — H25811 Combined forms of age-related cataract, right eye: Secondary | ICD-10-CM | POA: Diagnosis not present

## 2023-02-11 DIAGNOSIS — H25011 Cortical age-related cataract, right eye: Secondary | ICD-10-CM | POA: Diagnosis not present

## 2023-02-11 DIAGNOSIS — H2511 Age-related nuclear cataract, right eye: Secondary | ICD-10-CM | POA: Diagnosis not present

## 2023-02-25 DIAGNOSIS — H25812 Combined forms of age-related cataract, left eye: Secondary | ICD-10-CM | POA: Diagnosis not present

## 2023-02-25 DIAGNOSIS — H2512 Age-related nuclear cataract, left eye: Secondary | ICD-10-CM | POA: Diagnosis not present

## 2023-02-25 DIAGNOSIS — H25012 Cortical age-related cataract, left eye: Secondary | ICD-10-CM | POA: Diagnosis not present

## 2023-03-11 DIAGNOSIS — Z23 Encounter for immunization: Secondary | ICD-10-CM | POA: Diagnosis not present

## 2023-03-16 DIAGNOSIS — Z23 Encounter for immunization: Secondary | ICD-10-CM | POA: Diagnosis not present

## 2023-03-24 ENCOUNTER — Other Ambulatory Visit: Payer: Self-pay | Admitting: Endocrinology

## 2023-03-24 DIAGNOSIS — E1110 Type 2 diabetes mellitus with ketoacidosis without coma: Secondary | ICD-10-CM

## 2023-03-24 DIAGNOSIS — M810 Age-related osteoporosis without current pathological fracture: Secondary | ICD-10-CM

## 2023-03-26 DIAGNOSIS — H353132 Nonexudative age-related macular degeneration, bilateral, intermediate dry stage: Secondary | ICD-10-CM | POA: Diagnosis not present

## 2023-03-26 DIAGNOSIS — Z961 Presence of intraocular lens: Secondary | ICD-10-CM | POA: Diagnosis not present

## 2023-03-29 ENCOUNTER — Other Ambulatory Visit: Payer: Self-pay | Admitting: Internal Medicine

## 2023-03-29 DIAGNOSIS — M810 Age-related osteoporosis without current pathological fracture: Secondary | ICD-10-CM

## 2023-03-29 DIAGNOSIS — E1110 Type 2 diabetes mellitus with ketoacidosis without coma: Secondary | ICD-10-CM

## 2023-03-30 ENCOUNTER — Other Ambulatory Visit: Payer: Self-pay | Admitting: Internal Medicine

## 2023-03-30 DIAGNOSIS — M810 Age-related osteoporosis without current pathological fracture: Secondary | ICD-10-CM

## 2023-03-30 DIAGNOSIS — E1110 Type 2 diabetes mellitus with ketoacidosis without coma: Secondary | ICD-10-CM

## 2023-03-31 ENCOUNTER — Other Ambulatory Visit: Payer: Medicare Other

## 2023-05-06 DIAGNOSIS — M8589 Other specified disorders of bone density and structure, multiple sites: Secondary | ICD-10-CM | POA: Diagnosis not present

## 2023-06-04 DIAGNOSIS — H353131 Nonexudative age-related macular degeneration, bilateral, early dry stage: Secondary | ICD-10-CM | POA: Diagnosis not present

## 2023-06-04 DIAGNOSIS — H43813 Vitreous degeneration, bilateral: Secondary | ICD-10-CM | POA: Diagnosis not present

## 2023-06-04 DIAGNOSIS — Z961 Presence of intraocular lens: Secondary | ICD-10-CM | POA: Diagnosis not present

## 2023-06-04 DIAGNOSIS — H43391 Other vitreous opacities, right eye: Secondary | ICD-10-CM | POA: Diagnosis not present

## 2023-06-07 DIAGNOSIS — Z1212 Encounter for screening for malignant neoplasm of rectum: Secondary | ICD-10-CM | POA: Diagnosis not present

## 2023-06-07 DIAGNOSIS — E785 Hyperlipidemia, unspecified: Secondary | ICD-10-CM | POA: Diagnosis not present

## 2023-06-07 DIAGNOSIS — R7989 Other specified abnormal findings of blood chemistry: Secondary | ICD-10-CM | POA: Diagnosis not present

## 2023-06-11 DIAGNOSIS — M810 Age-related osteoporosis without current pathological fracture: Secondary | ICD-10-CM | POA: Diagnosis not present

## 2023-06-14 DIAGNOSIS — J309 Allergic rhinitis, unspecified: Secondary | ICD-10-CM | POA: Diagnosis not present

## 2023-06-14 DIAGNOSIS — R82998 Other abnormal findings in urine: Secondary | ICD-10-CM | POA: Diagnosis not present

## 2023-06-14 DIAGNOSIS — Z8249 Family history of ischemic heart disease and other diseases of the circulatory system: Secondary | ICD-10-CM | POA: Diagnosis not present

## 2023-06-14 DIAGNOSIS — R5383 Other fatigue: Secondary | ICD-10-CM | POA: Diagnosis not present

## 2023-06-14 DIAGNOSIS — E785 Hyperlipidemia, unspecified: Secondary | ICD-10-CM | POA: Diagnosis not present

## 2023-06-14 DIAGNOSIS — N200 Calculus of kidney: Secondary | ICD-10-CM | POA: Diagnosis not present

## 2023-06-14 DIAGNOSIS — Z0000 Encounter for general adult medical examination without abnormal findings: Secondary | ICD-10-CM | POA: Diagnosis not present

## 2023-06-14 DIAGNOSIS — M810 Age-related osteoporosis without current pathological fracture: Secondary | ICD-10-CM | POA: Diagnosis not present

## 2023-06-14 DIAGNOSIS — G4700 Insomnia, unspecified: Secondary | ICD-10-CM | POA: Diagnosis not present

## 2023-06-14 DIAGNOSIS — Z1331 Encounter for screening for depression: Secondary | ICD-10-CM | POA: Diagnosis not present

## 2023-06-14 DIAGNOSIS — F419 Anxiety disorder, unspecified: Secondary | ICD-10-CM | POA: Diagnosis not present

## 2023-06-14 DIAGNOSIS — Z1339 Encounter for screening examination for other mental health and behavioral disorders: Secondary | ICD-10-CM | POA: Diagnosis not present

## 2023-06-14 DIAGNOSIS — G43909 Migraine, unspecified, not intractable, without status migrainosus: Secondary | ICD-10-CM | POA: Diagnosis not present

## 2023-07-06 DIAGNOSIS — H40023 Open angle with borderline findings, high risk, bilateral: Secondary | ICD-10-CM | POA: Diagnosis not present

## 2023-09-09 DIAGNOSIS — Z6820 Body mass index (BMI) 20.0-20.9, adult: Secondary | ICD-10-CM | POA: Diagnosis not present

## 2023-09-09 DIAGNOSIS — Z1231 Encounter for screening mammogram for malignant neoplasm of breast: Secondary | ICD-10-CM | POA: Diagnosis not present

## 2023-09-09 DIAGNOSIS — D259 Leiomyoma of uterus, unspecified: Secondary | ICD-10-CM | POA: Diagnosis not present

## 2023-09-09 DIAGNOSIS — Z124 Encounter for screening for malignant neoplasm of cervix: Secondary | ICD-10-CM | POA: Diagnosis not present

## 2023-09-16 DIAGNOSIS — L738 Other specified follicular disorders: Secondary | ICD-10-CM | POA: Diagnosis not present

## 2023-09-16 DIAGNOSIS — L570 Actinic keratosis: Secondary | ICD-10-CM | POA: Diagnosis not present

## 2023-09-16 DIAGNOSIS — L720 Epidermal cyst: Secondary | ICD-10-CM | POA: Diagnosis not present

## 2023-09-16 DIAGNOSIS — L820 Inflamed seborrheic keratosis: Secondary | ICD-10-CM | POA: Diagnosis not present

## 2023-09-16 DIAGNOSIS — L821 Other seborrheic keratosis: Secondary | ICD-10-CM | POA: Diagnosis not present

## 2023-09-16 DIAGNOSIS — Z85828 Personal history of other malignant neoplasm of skin: Secondary | ICD-10-CM | POA: Diagnosis not present

## 2023-09-30 DIAGNOSIS — Z23 Encounter for immunization: Secondary | ICD-10-CM | POA: Diagnosis not present

## 2023-10-13 DIAGNOSIS — E785 Hyperlipidemia, unspecified: Secondary | ICD-10-CM | POA: Diagnosis not present

## 2023-10-13 DIAGNOSIS — M810 Age-related osteoporosis without current pathological fracture: Secondary | ICD-10-CM | POA: Diagnosis not present

## 2023-10-13 DIAGNOSIS — J309 Allergic rhinitis, unspecified: Secondary | ICD-10-CM | POA: Diagnosis not present

## 2023-10-13 DIAGNOSIS — G43909 Migraine, unspecified, not intractable, without status migrainosus: Secondary | ICD-10-CM | POA: Diagnosis not present

## 2023-10-13 DIAGNOSIS — F419 Anxiety disorder, unspecified: Secondary | ICD-10-CM | POA: Diagnosis not present

## 2023-10-13 DIAGNOSIS — R5383 Other fatigue: Secondary | ICD-10-CM | POA: Diagnosis not present

## 2023-10-13 DIAGNOSIS — N200 Calculus of kidney: Secondary | ICD-10-CM | POA: Diagnosis not present

## 2023-10-13 DIAGNOSIS — H353131 Nonexudative age-related macular degeneration, bilateral, early dry stage: Secondary | ICD-10-CM | POA: Diagnosis not present

## 2023-10-13 DIAGNOSIS — G4700 Insomnia, unspecified: Secondary | ICD-10-CM | POA: Diagnosis not present

## 2023-10-13 DIAGNOSIS — D126 Benign neoplasm of colon, unspecified: Secondary | ICD-10-CM | POA: Diagnosis not present

## 2023-10-13 DIAGNOSIS — Z8249 Family history of ischemic heart disease and other diseases of the circulatory system: Secondary | ICD-10-CM | POA: Diagnosis not present

## 2023-11-02 ENCOUNTER — Other Ambulatory Visit (HOSPITAL_BASED_OUTPATIENT_CLINIC_OR_DEPARTMENT_OTHER): Payer: Self-pay | Admitting: Internal Medicine

## 2023-11-02 DIAGNOSIS — M810 Age-related osteoporosis without current pathological fracture: Secondary | ICD-10-CM

## 2023-11-03 DIAGNOSIS — Z961 Presence of intraocular lens: Secondary | ICD-10-CM | POA: Diagnosis not present

## 2023-11-03 DIAGNOSIS — H43813 Vitreous degeneration, bilateral: Secondary | ICD-10-CM | POA: Diagnosis not present

## 2023-11-03 DIAGNOSIS — H40013 Open angle with borderline findings, low risk, bilateral: Secondary | ICD-10-CM | POA: Diagnosis not present

## 2023-11-03 DIAGNOSIS — H04123 Dry eye syndrome of bilateral lacrimal glands: Secondary | ICD-10-CM | POA: Diagnosis not present

## 2023-11-03 DIAGNOSIS — H353131 Nonexudative age-related macular degeneration, bilateral, early dry stage: Secondary | ICD-10-CM | POA: Diagnosis not present

## 2023-12-17 ENCOUNTER — Encounter: Payer: Self-pay | Admitting: Advanced Practice Midwife

## 2024-03-17 DIAGNOSIS — Z23 Encounter for immunization: Secondary | ICD-10-CM | POA: Diagnosis not present

## 2024-04-05 ENCOUNTER — Ambulatory Visit (HOSPITAL_BASED_OUTPATIENT_CLINIC_OR_DEPARTMENT_OTHER)

## 2024-04-05 ENCOUNTER — Encounter (HOSPITAL_BASED_OUTPATIENT_CLINIC_OR_DEPARTMENT_OTHER): Payer: Self-pay

## 2024-05-01 DIAGNOSIS — M8589 Other specified disorders of bone density and structure, multiple sites: Secondary | ICD-10-CM | POA: Diagnosis not present

## 2024-05-04 DIAGNOSIS — H40023 Open angle with borderline findings, high risk, bilateral: Secondary | ICD-10-CM | POA: Diagnosis not present

## 2024-05-11 DIAGNOSIS — M8589 Other specified disorders of bone density and structure, multiple sites: Secondary | ICD-10-CM | POA: Diagnosis not present
